# Patient Record
Sex: Female | Born: 1980 | Race: Black or African American | Hispanic: No | Marital: Married | Smoking: Never smoker
Health system: Southern US, Community
[De-identification: ages and names within clinical notes are randomized; demographics above are authoritative.]

## PROBLEM LIST (undated history)

## (undated) DIAGNOSIS — O09529 Supervision of elderly multigravida, unspecified trimester: Secondary | ICD-10-CM

## (undated) DIAGNOSIS — D649 Anemia, unspecified: Secondary | ICD-10-CM

## (undated) DIAGNOSIS — K589 Irritable bowel syndrome without diarrhea: Secondary | ICD-10-CM

## (undated) DIAGNOSIS — Z789 Other specified health status: Secondary | ICD-10-CM

## (undated) HISTORY — DX: Irritable bowel syndrome, unspecified: K58.9

## (undated) HISTORY — DX: Other specified health status: Z78.9

---

## 2006-05-23 ENCOUNTER — Ambulatory Visit: Payer: Self-pay | Admitting: Internal Medicine

## 2007-10-04 DIAGNOSIS — K219 Gastro-esophageal reflux disease without esophagitis: Secondary | ICD-10-CM | POA: Insufficient documentation

## 2007-10-04 DIAGNOSIS — K589 Irritable bowel syndrome without diarrhea: Secondary | ICD-10-CM

## 2008-01-24 ENCOUNTER — Encounter (INDEPENDENT_AMBULATORY_CARE_PROVIDER_SITE_OTHER): Payer: Self-pay | Admitting: *Deleted

## 2008-01-24 ENCOUNTER — Emergency Department (HOSPITAL_COMMUNITY): Admission: EM | Admit: 2008-01-24 | Discharge: 2008-01-24 | Payer: Self-pay | Admitting: Emergency Medicine

## 2008-01-28 ENCOUNTER — Telehealth: Payer: Self-pay | Admitting: Internal Medicine

## 2008-02-05 ENCOUNTER — Ambulatory Visit: Payer: Self-pay | Admitting: Internal Medicine

## 2008-02-05 ENCOUNTER — Encounter: Payer: Self-pay | Admitting: Physician Assistant

## 2008-02-05 DIAGNOSIS — R142 Eructation: Secondary | ICD-10-CM

## 2008-02-05 DIAGNOSIS — K5900 Constipation, unspecified: Secondary | ICD-10-CM | POA: Insufficient documentation

## 2008-02-05 DIAGNOSIS — R1084 Generalized abdominal pain: Secondary | ICD-10-CM | POA: Insufficient documentation

## 2008-02-05 DIAGNOSIS — R143 Flatulence: Secondary | ICD-10-CM

## 2008-02-05 DIAGNOSIS — R109 Unspecified abdominal pain: Secondary | ICD-10-CM

## 2008-02-05 DIAGNOSIS — R141 Gas pain: Secondary | ICD-10-CM

## 2008-02-07 ENCOUNTER — Ambulatory Visit: Payer: Self-pay | Admitting: Cardiology

## 2008-02-12 ENCOUNTER — Encounter: Payer: Self-pay | Admitting: Physician Assistant

## 2008-03-10 ENCOUNTER — Ambulatory Visit: Payer: Self-pay | Admitting: Internal Medicine

## 2008-07-16 ENCOUNTER — Telehealth: Payer: Self-pay | Admitting: Internal Medicine

## 2009-04-07 ENCOUNTER — Telehealth: Payer: Self-pay | Admitting: Internal Medicine

## 2010-03-09 NOTE — Progress Notes (Signed)
Summary: Medication refill   Phone Note Call from Patient   Caller: Patient Call For: Dr. Juanda Chance Reason for Call: Refill Medication Summary of Call: Pt needs her Miralax refilled Initial call taken by: Karna Christmas,  April 07, 2009 12:47 PM  Follow-up for Phone Call        Prescription sent. Follow-up by: Hortense Ramal CMA Duncan Dull),  April 07, 2009 12:56 PM    Prescriptions: MIRALAX  POWD (POLYETHYLENE GLYCOL 3350) Use 17 grams daily in 8 oz of water  #527 grams x 3   Entered by:   Hortense Ramal CMA (AAMA)   Authorized by:   Hart Carwin MD   Signed by:   Hortense Ramal CMA (AAMA) on 04/07/2009   Method used:   Electronically to        [facility]  [phone]* (retail)       [address]       [place], [place]  [address]       Ph: [phone] or [phone]       Fax: [phone]   RxID:   [id number]

## 2010-06-08 ENCOUNTER — Other Ambulatory Visit: Payer: Self-pay | Admitting: Internal Medicine

## 2010-06-25 NOTE — Assessment & Plan Note (Signed)
[facility]                         GASTROENTEROLOGY OFFICE NOTE   NOVELLE, ADDAIR                     MRN:          [id number]  DATE:05/23/2006                            DOB:          [birth year]    Marissa Kline is a very nice 30 year old African American female who comes  today for evaluation of periodic attacks of crampy abdominal pain that  had been occurring for several months.  They usually start within 30  minutes post prandially, usually after an evening meal and build up to a  severe crampy abdominal pain when she describes herself being doubled  over.  She experiences cramps until she has several bowel movements and  possibly even diarrhea.  After complete evacuation she feels much better  and is usually well for several weeks or several days at least.  She had  2 attacks last week, prior to that she was well for about 2 or 3 weeks.  Her daily schedule is regular; she usually eats breakfast, lunch, and  supper.  She eats at home, according to her most of the time.  She on  occasion has awakened at night with crampy abdominal pain.  There is no  family history of inflammatory bowel disease or bowel obstruction.  Her  mother currently is constipated.  Marissa Kline herself does not have very  good bowel habits, going to the bathroom about 2 or 3 times a week.  Her  weight has been stable.  She feels that her cramps are possibly worse  before the period.  She has never seen blood in her stool.   MEDICATIONS:  Diflucan 100 mg  one p.o. weekly.   PAST HISTORY:  Unremarkable, no operations.   FAMILY HISTORY:  Negative for colon cancer, inflammatory bowel disease.  Her mother has diabetes.   SOCIAL HISTORY:  Single, she works as a Investment banker, corporate.  She has a  Bachelor's Degree, she does not smoke, does not drink.   REVIEW OF SYSTEMS:  Positive for eyeglasses and allergies.  Pain with  periods.   PHYSICAL EXAMINATION:  Blood pressure 104/68,  pulse 68, weight 127  pounds.  She was healthy-appearing in no distress.  Sclerae was  nonicteric.  NECK:  Supple without adenopathy.  LUNGS:  Clear to auscultation.  COR:  Normal S1, normal S2.  ABDOMEN:  Soft with normoactive bowel sounds, no distention, good  muscular support.  Liver edge at costal margin.  I could not elicit any  tenderness, specifically no fullness in right lower quadrant.  No CVA  tenderness.  RECTAL:  Normal perianal area, rectum was normal, stool was Hemoccult  negative.   IMPRESSION:  A 30 year old female with symptoms of irritable bowel  syndrome and gastrocolic reflux described as crampy abdominal pain post  prandially, relieved by bowel movement.  Precipitating factors may be  fatty foods or larger meals.  her constipation may add to the problem.  There is no  clinical evidence to suggest inflammatory bowel disease.   PLAN:  1. We have talked extensively about irritable bowel syndrome, need for      high fiber diet, regular  eating habits, low fat lmodifications, and      either fiber supplements, stool softener, or probiotics of her      choice to regular her bowel habits.  The patient will start on      probiotic Activia yogurt daily.  If it does not work she will try      stool softeners, and if it does not work use mild laxative.  2. Levsin sublingually 0.125 mg dispense 30 with two refills, take 1      or 2 p.r.n. crampy abdominal pain.  3. Booklet on irritable bowel syndrome.  4. High fiber diet guidelines.   She will return on p.r.n. basis.     Hedwig Morton. Juanda Chance, MD  Electronically Signed    DMB/MedQ  DD: 05/23/2006  DT: 05/23/2006  Job #: [id number]   cc:   Sigmund Hazel, M.D.

## 2010-11-12 LAB — CBC
HCT: 35.7 % — ABNORMAL LOW (ref 36.0–46.0)
Hemoglobin: 11.9 g/dL — ABNORMAL LOW (ref 12.0–15.0)
MCHC: 33.3 g/dL (ref 30.0–36.0)
MCV: 86.8 fL (ref 78.0–100.0)
Platelets: 255 K/uL (ref 150–400)
RBC: 4.11 MIL/uL (ref 3.87–5.11)
RDW: 12.9 % (ref 11.5–15.5)
WBC: 5.2 K/uL (ref 4.0–10.5)

## 2010-11-12 LAB — DIFFERENTIAL
Basophils Absolute: 0 K/uL (ref 0.0–0.1)
Basophils Relative: 0 % (ref 0–1)
Eosinophils Absolute: 0.2 K/uL (ref 0.0–0.7)
Eosinophils Relative: 4 % (ref 0–5)
Lymphocytes Relative: 22 % (ref 12–46)
Lymphs Abs: 1.1 K/uL (ref 0.7–4.0)
Monocytes Absolute: 0.6 K/uL (ref 0.1–1.0)
Monocytes Relative: 11 % (ref 3–12)
Neutro Abs: 3.3 K/uL (ref 1.7–7.7)
Neutrophils Relative %: 64 % (ref 43–77)

## 2010-11-12 LAB — URINALYSIS, ROUTINE W REFLEX MICROSCOPIC
Bilirubin Urine: NEGATIVE
Glucose, UA: NEGATIVE mg/dL
Hgb urine dipstick: NEGATIVE
Ketones, ur: NEGATIVE mg/dL
Nitrite: NEGATIVE
Protein, ur: NEGATIVE mg/dL
Specific Gravity, Urine: 1.012 (ref 1.005–1.030)
Urobilinogen, UA: 0.2 mg/dL (ref 0.0–1.0)
pH: 6 (ref 5.0–8.0)

## 2010-11-12 LAB — COMPREHENSIVE METABOLIC PANEL
BUN: 9 mg/dL (ref 6–23)
CO2: 25 mEq/L (ref 19–32)
Chloride: 99 mEq/L (ref 96–112)
Creatinine, Ser: 0.58 mg/dL (ref 0.4–1.2)
GFR calc non Af Amer: >60 mL/min (ref >60)
Glucose, Bld: 93 mg/dL (ref 70–99)
Total Bilirubin: 1 mg/dL (ref 0.3–1.2)

## 2010-11-12 LAB — PREGNANCY, URINE: Preg Test, Ur: NEGATIVE

## 2010-11-12 LAB — LIPASE, BLOOD: Lipase: 28 U/L (ref 11–59)

## 2011-04-19 ENCOUNTER — Other Ambulatory Visit: Payer: Self-pay | Admitting: Internal Medicine

## 2011-04-19 NOTE — Telephone Encounter (Signed)
I am unable to give her any refills. We have not seen her in over 3 years. She is welcome to make an office visit if she would like [place] to supply a prescription. I have left a voicemail to advise patient.

## 2012-08-31 LAB — OB RESULTS CONSOLE GC/CHLAMYDIA
Chlamydia: NEGATIVE
Gonorrhea: NEGATIVE

## 2012-08-31 LAB — OB RESULTS CONSOLE HEPATITIS B SURFACE ANTIGEN: HEP B S AG: NEGATIVE

## 2012-08-31 LAB — OB RESULTS CONSOLE RUBELLA ANTIBODY, IGM: RUBELLA: IMMUNE

## 2012-08-31 LAB — OB RESULTS CONSOLE HIV ANTIBODY (ROUTINE TESTING): HIV: NONREACTIVE

## 2012-08-31 LAB — OB RESULTS CONSOLE ABO/RH: RH TYPE: POSITIVE

## 2012-08-31 LAB — OB RESULTS CONSOLE ANTIBODY SCREEN: Antibody Screen: NEGATIVE

## 2012-08-31 LAB — OB RESULTS CONSOLE RPR: RPR: NONREACTIVE

## 2013-02-20 LAB — OB RESULTS CONSOLE GBS: GBS: NEGATIVE

## 2013-03-05 ENCOUNTER — Ambulatory Visit (INDEPENDENT_AMBULATORY_CARE_PROVIDER_SITE_OTHER): Payer: 59 | Admitting: Physician Assistant

## 2013-03-05 VITALS — BP 110/68 | HR 112 | Temp 98.4°F | Resp 16 | Ht 61.25 in | Wt 177.0 lb

## 2013-03-05 DIAGNOSIS — J069 Acute upper respiratory infection, unspecified: Secondary | ICD-10-CM

## 2013-03-05 DIAGNOSIS — J029 Acute pharyngitis, unspecified: Secondary | ICD-10-CM

## 2013-03-05 DIAGNOSIS — B9789 Other viral agents as the cause of diseases classified elsewhere: Secondary | ICD-10-CM

## 2013-03-05 LAB — POCT CBC
Granulocyte percent: 71.1 %G (ref 37–80)
HEMATOCRIT: 32.5 % — AB (ref 37.7–47.9)
HEMOGLOBIN: 10 g/dL — AB (ref 12.2–16.2)
LYMPH, POC: 1.2 (ref 0.6–3.4)
MCH: 26.2 pg — AB (ref 27–31.2)
MCHC: 30.8 g/dL — AB (ref 31.8–35.4)
MCV: 85.3 fL (ref 80–97)
MID (cbc): 0.8 (ref 0–0.9)
MPV: 10.1 fL (ref 0–99.8)
POC GRANULOCYTE: 5 (ref 2–6.9)
POC LYMPH %: 17.8 % (ref 10–50)
POC MID %: 11.1 %M (ref 0–12)
Platelet Count, POC: 199 10*3/uL (ref 142–424)
RBC: 3.81 M/uL — AB (ref 4.04–5.48)
RDW, POC: 13.8 %
WBC: 7 10*3/uL (ref 4.6–10.2)

## 2013-03-05 LAB — POCT RAPID STREP A (OFFICE): Rapid Strep A Screen: NEGATIVE

## 2013-03-05 MED ORDER — GUAIFENESIN ER 1200 MG PO TB12: 1.0000 | ORAL_TABLET | Freq: Two times a day (BID) | ORAL | Status: AC

## 2013-03-05 NOTE — Progress Notes (Signed)
Subjective:    Patient ID: Marissa Kline, female    DOB: 03-08-1980, 33 y.o.   MRN: [id number]  HPI Pt presents to clinic with about 5 days of cold symptoms. She is having a worsening sore throat since yesterday.  Her sore throat was just in the am and it would get better with drinking and then last pm the sore throat was better in the afternoon and then last pm it was the worse that it has been.  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  OTC meds - Robitussin, Tylenol cold  39 weeks pregnancy Last appt 6 days ago (not problems) - next appt in 3 days No Flu vaccine  Review of Systems  Constitutional: Negative for fever and chills.  HENT: Positive for congestion, rhinorrhea (clear) and sore throat (worse in the am). Negative for postnasal drip.   Respiratory: Positive for cough (mild dry).   Musculoskeletal: Negative for myalgias.  Neurological: Negative for headaches.       Objective:   Physical Exam  Vitals reviewed. Constitutional: She is oriented to person, place, and time. She appears well-developed and well-nourished.  HENT:  Head: Normocephalic and atraumatic.  Right Ear: Hearing, tympanic membrane, external ear and ear canal normal.  Left Ear: Hearing, tympanic membrane, external ear and ear canal normal.  Nose: Mucosal edema present.  Mouth/Throat: Uvula is midline, oropharynx is clear and moist and mucous membranes are normal.  Eyes: Conjunctivae are normal.  Neck: Normal range of motion.  Cardiovascular: Normal rate, regular rhythm and normal heart sounds.   No murmur heard. Pulmonary/Chest: Effort normal and breath sounds normal. She has no wheezes.  Lymphadenopathy:    She has no cervical adenopathy.  Neurological: She is alert and oriented to person, place, and time.  Skin: Skin is warm and dry.  Psychiatric: She has a normal mood and affect. Her behavior is  normal. Judgment and thought content normal.   Results for orders placed in visit on 03/05/13  POCT RAPID STREP A (OFFICE)      Result Value Range   Rapid Strep A Screen Negative  Negative  POCT CBC      Result Value Range   WBC 7.0  4.6 - 10.2 K/uL   Lymph, poc 1.2  0.6 - 3.4   POC LYMPH PERCENT 17.8  10 - 50 %L   MID (cbc) 0.8  0 - 0.9   POC MID % 11.1  0 - 12 %M   POC Granulocyte 5.0  2 - 6.9   Granulocyte percent 71.1  37 - 80 %G   RBC 3.81 (*) 4.04 - 5.48 M/uL   Hemoglobin 10.0 (*) 12.2 - 16.2 g/dL   HCT, POC 40.9 (*) 81.1 - 47.9 %   MCV 85.3  80 - 97 fL   MCH, POC 26.2 (*) 27 - 31.2 pg   MCHC 30.8 (*) 31.8 - 35.4 g/dL   RDW, POC 91.4     Platelet Count, POC 199  142 - 424 K/uL   MPV 10.1  0 - 99.8 fL       Assessment & Plan:  Sore throat - Plan: POCT rapid strep A, POCT CBC  Viral URI with cough - Plan: Guaifenesin (MUCINEX MAXIMUM STRENGTH) 1200 MG TB12  We will symptomatically treat her with Mucinex - She can use a netti-pot or nasal saline to help with  congestion.  She will try a humidifier in her bedroom to help with congestion.  She will go to her normal schedule OB care in 3 days.  Pt has known anemia with this pregnancy.  Lasy Hemoglobin was 9.5 so today's is improved.  Benny LennertSarah Masaichi Kracht PA-C 03/05/2013 2:20 PM

## 2013-03-05 NOTE — Patient Instructions (Signed)
Hot liquids for sore thraot - honey can help sooth the throat also Humidifier in the bedroom can help with dry throat in the am Continue Tylenol for the pain in the throat - no Motrin or Advil or Ibuprofen

## 2013-03-13 ENCOUNTER — Inpatient Hospital Stay (HOSPITAL_COMMUNITY): Admission: AD | Admit: 2013-03-13 | Payer: Self-pay | Source: Ambulatory Visit | Admitting: Obstetrics and Gynecology

## 2013-03-18 ENCOUNTER — Telehealth (HOSPITAL_COMMUNITY): Payer: Self-pay | Admitting: *Deleted

## 2013-03-18 ENCOUNTER — Encounter (HOSPITAL_COMMUNITY): Payer: Self-pay | Admitting: *Deleted

## 2013-03-18 NOTE — Telephone Encounter (Signed)
Preadmission screen  

## 2013-03-21 ENCOUNTER — Encounter (HOSPITAL_COMMUNITY): Payer: Self-pay

## 2013-03-21 ENCOUNTER — Inpatient Hospital Stay (HOSPITAL_COMMUNITY)
Admission: RE | Admit: 2013-03-21 | Discharge: 2013-03-24 | DRG: 766 | Disposition: A | Discharge status: Home / Self Care | Payer: 59 | Source: Ambulatory Visit | Attending: Obstetrics and Gynecology | Admitting: Obstetrics and Gynecology

## 2013-03-21 VITALS — BP 114/79 | HR 72 | Temp 98.2°F | Resp 18 | Ht 61.0 in | Wt 180.0 lb

## 2013-03-21 DIAGNOSIS — O480 Post-term pregnancy: Principal | ICD-10-CM | POA: Diagnosis present

## 2013-03-21 DIAGNOSIS — Z9889 Other specified postprocedural states: Secondary | ICD-10-CM

## 2013-03-21 LAB — CBC
HCT: 31.3 % — ABNORMAL LOW (ref 36.0–46.0)
Hemoglobin: 10.4 g/dL — ABNORMAL LOW (ref 12.0–15.0)
MCH: 26.4 pg (ref 26.0–34.0)
MCHC: 33.2 g/dL (ref 30.0–36.0)
MCV: 79.4 fL (ref 78.0–100.0)
PLATELETS: 214 10*3/uL (ref 150–400)
RBC: 3.94 MIL/uL (ref 3.87–5.11)
RDW: 14.6 % (ref 11.5–15.5)
WBC: 9 10*3/uL (ref 4.0–10.5)

## 2013-03-21 LAB — TYPE AND SCREEN
ABO/RH(D): A POS
Antibody Screen: NEGATIVE

## 2013-03-21 MED ORDER — CITRIC ACID-SODIUM CITRATE 334-500 MG/5ML PO SOLN
30.0000 mL | ORAL | Status: DC | PRN
  Administered 2013-03-22: 30 mL via ORAL
  Filled 2013-03-21: qty 15

## 2013-03-21 MED ORDER — OXYCODONE-ACETAMINOPHEN 5-325 MG PO TABS: 1.0000 | ORAL_TABLET | ORAL | Status: DC | PRN

## 2013-03-21 MED ORDER — ZOLPIDEM TARTRATE 5 MG PO TABS
5.0000 mg | ORAL_TABLET | Freq: Every evening | ORAL | Status: DC | PRN
  Administered 2013-03-22: 5 mg via ORAL
  Filled 2013-03-21: qty 1

## 2013-03-21 MED ORDER — MISOPROSTOL 25 MCG QUARTER TABLET
25.0000 ug | ORAL_TABLET | ORAL | Status: DC | PRN
  Administered 2013-03-21: 25 ug via VAGINAL
  Filled 2013-03-21: qty 0.25

## 2013-03-21 MED ORDER — ACETAMINOPHEN 325 MG PO TABS: 650.0000 mg | ORAL_TABLET | ORAL | Status: DC | PRN

## 2013-03-21 MED ORDER — IBUPROFEN 600 MG PO TABS: 600.0000 mg | ORAL_TABLET | Freq: Four times a day (QID) | ORAL | Status: DC | PRN

## 2013-03-21 MED ORDER — OXYTOCIN 40 UNITS IN LACTATED RINGERS INFUSION - SIMPLE MED: 62.5000 mL/h | INTRAVENOUS | Status: DC

## 2013-03-21 MED ORDER — ONDANSETRON HCL 4 MG/2ML IJ SOLN: 4.0000 mg | Freq: Four times a day (QID) | INTRAMUSCULAR | Status: DC | PRN

## 2013-03-21 MED ORDER — LACTATED RINGERS IV SOLN
500.0000 mL | INTRAVENOUS | Status: DC | PRN
  Administered 2013-03-22: 500 mL via INTRAVENOUS

## 2013-03-21 MED ORDER — OXYTOCIN BOLUS FROM INFUSION: 500.0000 mL | INTRAVENOUS | Status: DC

## 2013-03-21 MED ORDER — BUTORPHANOL TARTRATE 1 MG/ML IJ SOLN
1.0000 mg | INTRAMUSCULAR | Status: DC | PRN
  Administered 2013-03-22 (×2): 1 mg via INTRAVENOUS
  Filled 2013-03-21 (×2): qty 1

## 2013-03-21 MED ORDER — LACTATED RINGERS IV SOLN
INTRAVENOUS | Status: DC
  Administered 2013-03-21 – 2013-03-22 (×3): via INTRAVENOUS

## 2013-03-21 MED ORDER — TERBUTALINE SULFATE 1 MG/ML IJ SOLN
0.2500 mg | Freq: Once | INTRAMUSCULAR | Status: AC | PRN
Start: 2013-03-21 — End: 2013-03-21

## 2013-03-21 MED ORDER — LIDOCAINE HCL (PF) 1 % IJ SOLN: 30.0000 mL | INTRAMUSCULAR | Status: DC | PRN

## 2013-03-22 ENCOUNTER — Inpatient Hospital Stay (HOSPITAL_COMMUNITY): Payer: 59 | Admitting: Anesthesiology

## 2013-03-22 ENCOUNTER — Encounter (HOSPITAL_COMMUNITY): Payer: 59 | Admitting: Anesthesiology

## 2013-03-22 ENCOUNTER — Encounter (HOSPITAL_COMMUNITY)
Admission: RE | Disposition: A | Discharge status: Home / Self Care | Payer: Self-pay | Source: Ambulatory Visit | Attending: Obstetrics and Gynecology

## 2013-03-22 ENCOUNTER — Encounter (HOSPITAL_COMMUNITY): Payer: Self-pay

## 2013-03-22 DIAGNOSIS — Z9889 Other specified postprocedural states: Secondary | ICD-10-CM

## 2013-03-22 LAB — RPR: RPR Ser Ql: NONREACTIVE

## 2013-03-22 LAB — ABO/RH: ABO/RH(D): A POS

## 2013-03-22 SURGERY — Surgical Case
Anesthesia: Epidural | Site: Abdomen

## 2013-03-22 MED ORDER — TETANUS-DIPHTH-ACELL PERTUSSIS 5-2.5-18.5 LF-MCG/0.5 IM SUSP: 0.5000 mL | Freq: Once | INTRAMUSCULAR | Status: DC

## 2013-03-22 MED ORDER — FERROUS SULFATE 325 (65 FE) MG PO TABS
325.0000 mg | ORAL_TABLET | Freq: Two times a day (BID) | ORAL | Status: DC
  Administered 2013-03-22 – 2013-03-24 (×4): 325 mg via ORAL
  Filled 2013-03-22 (×4): qty 1

## 2013-03-22 MED ORDER — LACTATED RINGERS IV SOLN
INTRAVENOUS | Status: DC
  Administered 2013-03-22: 23:00:00 via INTRAVENOUS

## 2013-03-22 MED ORDER — DIPHENHYDRAMINE HCL 50 MG/ML IJ SOLN: 12.5000 mg | INTRAMUSCULAR | Status: DC | PRN

## 2013-03-22 MED ORDER — PROMETHAZINE HCL 25 MG/ML IJ SOLN: 6.2500 mg | INTRAMUSCULAR | Status: DC | PRN

## 2013-03-22 MED ORDER — PHENYLEPHRINE 40 MCG/ML (10ML) SYRINGE FOR IV PUSH (FOR BLOOD PRESSURE SUPPORT): 80.0000 ug | PREFILLED_SYRINGE | INTRAVENOUS | Status: DC | PRN

## 2013-03-22 MED ORDER — HYDROMORPHONE HCL PF 1 MG/ML IJ SOLN
INTRAMUSCULAR | Status: AC
  Filled 2013-03-22: qty 1

## 2013-03-22 MED ORDER — FAMOTIDINE-CA CARB-MAG HYDROX 10-800-165 MG PO CHEW: 1.0000 | CHEWABLE_TABLET | Freq: Two times a day (BID) | ORAL | Status: DC | PRN

## 2013-03-22 MED ORDER — LANOLIN HYDROUS EX OINT: 1.0000 "application " | TOPICAL_OINTMENT | CUTANEOUS | Status: DC | PRN

## 2013-03-22 MED ORDER — FENTANYL 2.5 MCG/ML BUPIVACAINE 1/10 % EPIDURAL INFUSION (WH - ANES): 14.0000 mL/h | INTRAMUSCULAR | Status: DC | PRN

## 2013-03-22 MED ORDER — FLEET ENEMA 7-19 GM/118ML RE ENEM: 1.0000 | ENEMA | Freq: Every day | RECTAL | Status: DC | PRN

## 2013-03-22 MED ORDER — BISACODYL 10 MG RE SUPP: 10.0000 mg | Freq: Every day | RECTAL | Status: DC | PRN

## 2013-03-22 MED ORDER — NALBUPHINE HCL 10 MG/ML IJ SOLN: 5.0000 mg | INTRAMUSCULAR | Status: DC | PRN

## 2013-03-22 MED ORDER — METHYLERGONOVINE MALEATE 0.2 MG/ML IJ SOLN: 0.2000 mg | INTRAMUSCULAR | Status: DC | PRN

## 2013-03-22 MED ORDER — MEASLES, MUMPS & RUBELLA VAC ~~LOC~~ INJ
0.5000 mL | INJECTION | Freq: Once | SUBCUTANEOUS | Status: DC
  Filled 2013-03-22: qty 0.5

## 2013-03-22 MED ORDER — ONDANSETRON HCL 4 MG/2ML IJ SOLN
4.0000 mg | INTRAMUSCULAR | Status: DC | PRN
  Administered 2013-03-22: 4 mg via INTRAVENOUS
  Filled 2013-03-22: qty 2

## 2013-03-22 MED ORDER — SODIUM BICARBONATE 8.4 % IV SOLN
INTRAVENOUS | Status: AC
  Filled 2013-03-22: qty 50

## 2013-03-22 MED ORDER — SCOPOLAMINE 1 MG/3DAYS TD PT72
1.0000 | MEDICATED_PATCH | Freq: Once | TRANSDERMAL | Status: DC
  Administered 2013-03-22: 1.5 mg via TRANSDERMAL

## 2013-03-22 MED ORDER — OXYTOCIN 40 UNITS IN LACTATED RINGERS INFUSION - SIMPLE MED: 62.5000 mL/h | INTRAVENOUS | Status: AC

## 2013-03-22 MED ORDER — MORPHINE SULFATE 0.5 MG/ML IJ SOLN
INTRAMUSCULAR | Status: AC
  Filled 2013-03-22: qty 10

## 2013-03-22 MED ORDER — HYDROMORPHONE HCL PF 1 MG/ML IJ SOLN
0.2500 mg | INTRAMUSCULAR | Status: DC | PRN
  Administered 2013-03-22 (×2): 0.5 mg via INTRAVENOUS

## 2013-03-22 MED ORDER — MEPERIDINE HCL 25 MG/ML IJ SOLN: 6.2500 mg | INTRAMUSCULAR | Status: DC | PRN

## 2013-03-22 MED ORDER — EPHEDRINE 5 MG/ML INJ
INTRAVENOUS | Status: DC
Start: 2013-03-22 — End: 2013-03-22
  Filled 2013-03-22: qty 4

## 2013-03-22 MED ORDER — KETOROLAC TROMETHAMINE 30 MG/ML IJ SOLN
30.0000 mg | Freq: Four times a day (QID) | INTRAMUSCULAR | Status: AC | PRN
Start: 2013-03-22 — End: 2013-03-23
  Administered 2013-03-22: 30 mg via INTRAMUSCULAR

## 2013-03-22 MED ORDER — SIMETHICONE 80 MG PO CHEW
80.0000 mg | CHEWABLE_TABLET | ORAL | Status: DC | PRN
Start: 2013-03-22 — End: 2013-03-24

## 2013-03-22 MED ORDER — NALBUPHINE HCL 10 MG/ML IJ SOLN
5.0000 mg | INTRAMUSCULAR | Status: DC | PRN
Start: 2013-03-22 — End: 2013-03-24

## 2013-03-22 MED ORDER — MORPHINE SULFATE (PF) 0.5 MG/ML IJ SOLN
INTRAMUSCULAR | Status: DC | PRN
  Administered 2013-03-22: 3 mg via EPIDURAL

## 2013-03-22 MED ORDER — SODIUM BICARBONATE 8.4 % IV SOLN
INTRAVENOUS | Status: DC | PRN
  Administered 2013-03-22: 4 mL via EPIDURAL
  Administered 2013-03-22: 3 mL via EPIDURAL
  Administered 2013-03-22: 12 mL via EPIDURAL

## 2013-03-22 MED ORDER — ONDANSETRON HCL 4 MG/2ML IJ SOLN: 4.0000 mg | Freq: Three times a day (TID) | INTRAMUSCULAR | Status: DC | PRN

## 2013-03-22 MED ORDER — SODIUM CHLORIDE 0.9 % IJ SOLN: 3.0000 mL | INTRAMUSCULAR | Status: DC | PRN

## 2013-03-22 MED ORDER — FENTANYL 2.5 MCG/ML BUPIVACAINE 1/10 % EPIDURAL INFUSION (WH - ANES)
INTRAMUSCULAR | Status: AC
  Filled 2013-03-22: qty 125

## 2013-03-22 MED ORDER — DIBUCAINE 1 % RE OINT: 1.0000 "application " | TOPICAL_OINTMENT | RECTAL | Status: DC | PRN

## 2013-03-22 MED ORDER — LIDOCAINE-EPINEPHRINE (PF) 2 %-1:200000 IJ SOLN
INTRAMUSCULAR | Status: AC
  Filled 2013-03-22: qty 20

## 2013-03-22 MED ORDER — METOCLOPRAMIDE HCL 5 MG/ML IJ SOLN: 10.0000 mg | Freq: Three times a day (TID) | INTRAMUSCULAR | Status: DC | PRN

## 2013-03-22 MED ORDER — OXYTOCIN 10 UNIT/ML IJ SOLN
40.0000 [IU] | INTRAVENOUS | Status: DC | PRN
  Administered 2013-03-22: 40 [IU] via INTRAVENOUS

## 2013-03-22 MED ORDER — MORPHINE SULFATE (PF) 0.5 MG/ML IJ SOLN
INTRAMUSCULAR | Status: DC | PRN
  Administered 2013-03-22: 2 mg via INTRAVENOUS

## 2013-03-22 MED ORDER — PHENYLEPHRINE 40 MCG/ML (10ML) SYRINGE FOR IV PUSH (FOR BLOOD PRESSURE SUPPORT)
PREFILLED_SYRINGE | INTRAVENOUS | Status: AC
  Filled 2013-03-22: qty 10

## 2013-03-22 MED ORDER — FENTANYL 2.5 MCG/ML BUPIVACAINE 1/10 % EPIDURAL INFUSION (WH - ANES)
INTRAMUSCULAR | Status: DC | PRN
  Administered 2013-03-22: 14 mL/h via EPIDURAL

## 2013-03-22 MED ORDER — EPHEDRINE 5 MG/ML INJ: 10.0000 mg | INTRAVENOUS | Status: DC | PRN

## 2013-03-22 MED ORDER — SCOPOLAMINE 1 MG/3DAYS TD PT72
MEDICATED_PATCH | TRANSDERMAL | Status: AC
  Administered 2013-03-22: 1.5 mg via TRANSDERMAL
  Filled 2013-03-22: qty 1

## 2013-03-22 MED ORDER — IBUPROFEN 600 MG PO TABS: 600.0000 mg | ORAL_TABLET | Freq: Four times a day (QID) | ORAL | Status: DC | PRN

## 2013-03-22 MED ORDER — ONDANSETRON HCL 4 MG/2ML IJ SOLN
INTRAMUSCULAR | Status: DC | PRN
  Administered 2013-03-22: 4 mg via INTRAVENOUS

## 2013-03-22 MED ORDER — DIPHENHYDRAMINE HCL 25 MG PO CAPS: 25.0000 mg | ORAL_CAPSULE | Freq: Four times a day (QID) | ORAL | Status: DC | PRN

## 2013-03-22 MED ORDER — PRENATAL MULTIVITAMIN CH
1.0000 | ORAL_TABLET | Freq: Every day | ORAL | Status: DC
  Administered 2013-03-23: 1 via ORAL
  Filled 2013-03-22: qty 1

## 2013-03-22 MED ORDER — LIDOCAINE HCL (PF) 1 % IJ SOLN
INTRAMUSCULAR | Status: DC | PRN
  Administered 2013-03-22: 9 mL
  Administered 2013-03-22: 8 mL

## 2013-03-22 MED ORDER — IBUPROFEN 600 MG PO TABS
600.0000 mg | ORAL_TABLET | Freq: Four times a day (QID) | ORAL | Status: DC
  Administered 2013-03-22 – 2013-03-24 (×7): 600 mg via ORAL
  Filled 2013-03-22 (×8): qty 1

## 2013-03-22 MED ORDER — KETOROLAC TROMETHAMINE 30 MG/ML IJ SOLN
INTRAMUSCULAR | Status: AC
  Administered 2013-03-22: 30 mg via INTRAMUSCULAR
  Filled 2013-03-22: qty 1

## 2013-03-22 MED ORDER — DEXTROSE 5 % IV SOLN
1.0000 ug/kg/h | INTRAVENOUS | Status: DC | PRN
  Filled 2013-03-22: qty 2

## 2013-03-22 MED ORDER — SIMETHICONE 80 MG PO CHEW
80.0000 mg | CHEWABLE_TABLET | ORAL | Status: DC
  Administered 2013-03-22 – 2013-03-24 (×2): 80 mg via ORAL
  Filled 2013-03-22 (×2): qty 1

## 2013-03-22 MED ORDER — KETOROLAC TROMETHAMINE 30 MG/ML IJ SOLN: 15.0000 mg | Freq: Once | INTRAMUSCULAR | Status: DC | PRN

## 2013-03-22 MED ORDER — ONDANSETRON HCL 4 MG/2ML IJ SOLN
INTRAMUSCULAR | Status: AC
  Filled 2013-03-22: qty 2

## 2013-03-22 MED ORDER — SIMETHICONE 80 MG PO CHEW
80.0000 mg | CHEWABLE_TABLET | Freq: Three times a day (TID) | ORAL | Status: DC
  Administered 2013-03-22 – 2013-03-24 (×5): 80 mg via ORAL
  Filled 2013-03-22 (×5): qty 1

## 2013-03-22 MED ORDER — NALOXONE HCL 0.4 MG/ML IJ SOLN: 0.4000 mg | INTRAMUSCULAR | Status: DC | PRN

## 2013-03-22 MED ORDER — KETOROLAC TROMETHAMINE 30 MG/ML IJ SOLN: 30.0000 mg | Freq: Four times a day (QID) | INTRAMUSCULAR | Status: AC | PRN

## 2013-03-22 MED ORDER — CEFAZOLIN SODIUM-DEXTROSE 2-3 GM-% IV SOLR
INTRAVENOUS | Status: DC | PRN
  Administered 2013-03-22: 2 g via INTRAVENOUS

## 2013-03-22 MED ORDER — FAMOTIDINE 20 MG PO TABS: 10.0000 mg | ORAL_TABLET | Freq: Two times a day (BID) | ORAL | Status: DC | PRN

## 2013-03-22 MED ORDER — METHYLERGONOVINE MALEATE 0.2 MG PO TABS: 0.2000 mg | ORAL_TABLET | ORAL | Status: DC | PRN

## 2013-03-22 MED ORDER — SENNOSIDES-DOCUSATE SODIUM 8.6-50 MG PO TABS
2.0000 | ORAL_TABLET | ORAL | Status: DC
  Administered 2013-03-22 – 2013-03-24 (×2): 2 via ORAL
  Filled 2013-03-22 (×2): qty 2

## 2013-03-22 MED ORDER — DIPHENHYDRAMINE HCL 25 MG PO CAPS
25.0000 mg | ORAL_CAPSULE | ORAL | Status: DC | PRN
  Filled 2013-03-22: qty 1

## 2013-03-22 MED ORDER — WITCH HAZEL-GLYCERIN EX PADS
1.0000 | MEDICATED_PAD | CUTANEOUS | Status: DC | PRN
Start: 2013-03-22 — End: 2013-03-24

## 2013-03-22 MED ORDER — MENTHOL 3 MG MT LOZG: 1.0000 | LOZENGE | OROMUCOSAL | Status: DC | PRN

## 2013-03-22 MED ORDER — LACTATED RINGERS IV SOLN
500.0000 mL | Freq: Once | INTRAVENOUS | Status: AC
  Administered 2013-03-22: 09:00:00 via INTRAVENOUS

## 2013-03-22 MED ORDER — OXYTOCIN 10 UNIT/ML IJ SOLN
INTRAMUSCULAR | Status: AC
  Filled 2013-03-22: qty 4

## 2013-03-22 MED ORDER — ZOLPIDEM TARTRATE 5 MG PO TABS: 5.0000 mg | ORAL_TABLET | Freq: Every evening | ORAL | Status: DC | PRN

## 2013-03-22 MED ORDER — ONDANSETRON HCL 4 MG PO TABS: 4.0000 mg | ORAL_TABLET | ORAL | Status: DC | PRN

## 2013-03-22 MED ORDER — DIPHENHYDRAMINE HCL 50 MG/ML IJ SOLN: 25.0000 mg | INTRAMUSCULAR | Status: DC | PRN

## 2013-03-22 MED ORDER — OXYCODONE-ACETAMINOPHEN 5-325 MG PO TABS: 1.0000 | ORAL_TABLET | ORAL | Status: DC | PRN

## 2013-03-22 SURGICAL SUPPLY — 37 items
APL SKNCLS STERI-STRIP NONHPOA (GAUZE/BANDAGES/DRESSINGS) ×1
BENZOIN TINCTURE PRP APPL 2/3 (GAUZE/BANDAGES/DRESSINGS) ×3 IMPLANT
CLAMP CORD UMBIL (MISCELLANEOUS) IMPLANT
CLOSURE WOUND 1/2 X4 (GAUZE/BANDAGES/DRESSINGS) ×1
CLOTH BEACON ORANGE TIMEOUT ST (SAFETY) ×3 IMPLANT
DRAPE LG THREE QUARTER DISP (DRAPES) IMPLANT
DRSG OPSITE POSTOP 4X10 (GAUZE/BANDAGES/DRESSINGS) ×3 IMPLANT
DURAPREP 26ML APPLICATOR (WOUND CARE) ×3 IMPLANT
ELECT REM PT RETURN 9FT ADLT (ELECTROSURGICAL) ×3
ELECTRODE REM PT RTRN 9FT ADLT (ELECTROSURGICAL) ×1 IMPLANT
EXTRACTOR VACUUM BELL STYLE (SUCTIONS) IMPLANT
GLOVE BIO SURGEON STRL SZ7 (GLOVE) ×3 IMPLANT
GOWN STRL REUS W/ TWL XL LVL3 (GOWN DISPOSABLE) ×1 IMPLANT
GOWN STRL REUS W/TWL LRG LVL3 (GOWN DISPOSABLE) ×3 IMPLANT
GOWN STRL REUS W/TWL XL LVL3 (GOWN DISPOSABLE) ×3
KIT ABG SYR 3ML LUER SLIP (SYRINGE) ×2 IMPLANT
NDL HYPO 25X5/8 SAFETYGLIDE (NEEDLE) IMPLANT
NEEDLE HYPO 25X5/8 SAFETYGLIDE (NEEDLE) ×3 IMPLANT
NS IRRIG 1000ML POUR BTL (IV SOLUTION) ×3 IMPLANT
PACK C SECTION WH (CUSTOM PROCEDURE TRAY) ×3 IMPLANT
PAD OB MATERNITY 4.3X12.25 (PERSONAL CARE ITEMS) ×3 IMPLANT
RETRACTOR WND ALEXIS 25 LRG (MISCELLANEOUS) ×1 IMPLANT
RTRCTR WOUND ALEXIS 25CM LRG (MISCELLANEOUS) ×3
STAPLER VISISTAT 35W (STAPLE) IMPLANT
STRIP CLOSURE SKIN 1/2X4 (GAUZE/BANDAGES/DRESSINGS) ×2 IMPLANT
SUT MNCRL 0 VIOLET CTX 36 (SUTURE) ×2 IMPLANT
SUT MONOCRYL 0 CTX 36 (SUTURE) ×6
SUT PDS AB 0 CTX 60 (SUTURE) IMPLANT
SUT PLAIN 2 0 XLH (SUTURE) ×2 IMPLANT
SUT VIC AB 0 CT1 27 (SUTURE) ×6
SUT VIC AB 0 CT1 27XBRD ANBCTR (SUTURE) ×2 IMPLANT
SUT VIC AB 2-0 CT1 27 (SUTURE) ×3
SUT VIC AB 2-0 CT1 TAPERPNT 27 (SUTURE) ×1 IMPLANT
SUT VIC AB 4-0 KS 27 (SUTURE) ×3 IMPLANT
TOWEL OR 17X24 6PK STRL BLUE (TOWEL DISPOSABLE) ×3 IMPLANT
TRAY FOLEY CATH 14FR (SET/KITS/TRAYS/PACK) ×3 IMPLANT
WATER STERILE IRR 1000ML POUR (IV SOLUTION) ×1 IMPLANT

## 2013-03-22 NOTE — Addendum Note (Signed)
Addendum created 03/22/13 1959 by Lincoln BrighamAngela Draughon Maryssa Giampietro, CRNA   Modules edited: Notes Section   Notes Section:  File: [id number]

## 2013-03-22 NOTE — Anesthesia Postprocedure Evaluation (Signed)
  Anesthesia Post Note  Patient: Benay SpiceSebrina A Holloman  Procedure(s) Performed: Procedure(s) (LRB): CESAREAN SECTION (N/A)  Anesthesia type: epi   Patient location: PACU  Post pain: Pain level controlled  Post assessment: Post-op Vital signs reviewed  Last Vitals:  Filed Vitals:   03/22/13 1145  BP: 108/43  Pulse: 82  Temp:   Resp: 18    Post vital signs: Reviewed  Level of consciousness: awake  Complications: No apparent anesthesia complications

## 2013-03-22 NOTE — Progress Notes (Signed)
After seeing pt earlier, pt received her epidural.  Since that time pt had another 3 minute severe variable decel down to 60s and several decels that appeared like lates just before and after.  SVE 1/50/-1; AROM thick mec.  Although strip has reactivity in between episodes, the fetus is clearly distressed and the decels are not reassuring so far from getting pt even into labor, let alone delivered.  I d/w this with the pt and she agrees to proceed with LTCS for nonreassuring FHTs remote from vaginal delivery.

## 2013-03-22 NOTE — Preoperative (Signed)
Beta Blockers   Reason not to administer Beta Blockers:Not Applicable 

## 2013-03-22 NOTE — Progress Notes (Signed)
03/22/13 1600  Clinical Encounter Type  Visited With Patient and family together (spouse)  Visit Type (Advance Directives consult)  Referral From Nurse (F. Morris, RN)   Visited briefly with Marissa Kline to offer spiritual/emotional support and requested information about Advance Directives.  She was experiencing significant nausea, so we focused on AD.  Provided her blue information sheet, noting that RN can provide AD packet if desired.  Spiritual Care can assist with discussing/reviewing material and completing forms if pt desires.  Please page [phone] if needed.  Thank you.  [address]Chaplain Eriyonna Matsushita [place], [place] [phone]

## 2013-03-22 NOTE — Lactation Note (Signed)
This note was copied from the chart of Marissa Lonia ChimeraSebrina Simmonds. Lactation Consultation Note Initial consult:  Baby Marissa 12 hours old sleeping STS on FOB.  Mother states he has been breastfeeding well.  RN taught mother hand expression and she had drops of colostrum.  Reviewed basics, lactation support services and brochure.  Mother wanted to know when she could get her free [facility] breast pump. Suggested she recheck with her insurance company and visit our store on Monday before she leaves.  Encouraged mother to call for further assistance.   Patient Name: Marissa Lonia ChimeraSebrina Colebank RUEAV'WToday's Date: 03/22/2013 Reason for consult: Initial assessment   Maternal Data Has patient been taught Hand Expression?: Yes Does the patient have breastfeeding experience prior to this delivery?: No  Feeding Feeding Type: Breast Fed Length of feed: 20 min  LATCH Score/Interventions Latch: Repeated attempts needed to sustain latch, nipple held in mouth throughout feeding, stimulation needed to elicit sucking reflex. Intervention(s): Adjust position;Assist with latch;Breast massage;Breast compression  Audible Swallowing: A few with stimulation Intervention(s): Skin to skin  Type of Nipple: Everted at rest and after stimulation  Comfort (Breast/Nipple): Soft / non-tender     Hold (Positioning): Assistance needed to correctly position infant at breast and maintain latch. Intervention(s): Support Pillows;Breastfeeding basics reviewed;Position options;Skin to skin  LATCH Score: 7  Lactation Tools Discussed/Used     Consult Status Consult Status: Follow-up Date: 03/23/13 Follow-up type: In-patient    Dahlia ByesBerkelhammer, Davetta Olliff [facility] 03/22/2013, 10:56 PM

## 2013-03-22 NOTE — Anesthesia Postprocedure Evaluation (Signed)
Anesthesia Post Note  Patient: Marissa Kline  Procedure(s) Performed: Procedure(s) (LRB): CESAREAN SECTION (N/A)  Anesthesia type: Epidural  Patient location: Mother/Baby  Post pain: Pain level controlled  Post assessment: Post-op Vital signs reviewed  Last Vitals:  Filed Vitals:   03/22/13 1934  BP: 105/72  Pulse: 103  Temp:   Resp: 18    Post vital signs: Reviewed  Level of consciousness:alert  Complications: No apparent anesthesia complications

## 2013-03-22 NOTE — Anesthesia Preprocedure Evaluation (Signed)
Anesthesia Evaluation  Patient identified by MRN, date of birth, ID band Patient awake    Reviewed: Allergy & Precautions, H&P , NPO status , Patient's Chart, lab work & pertinent test results  Airway Mallampati: II TM Distance: >3 FB Neck ROM: full    Dental no notable dental hx.    Pulmonary neg pulmonary ROS,    Pulmonary exam normal       Cardiovascular negative cardio ROS      Neuro/Psych negative neurological ROS  negative psych ROS   GI/Hepatic negative GI ROS, Neg liver ROS,   Endo/Other  negative endocrine ROS  Renal/GU negative Renal ROS  negative genitourinary   Musculoskeletal negative musculoskeletal ROS (+)   Abdominal Normal abdominal exam  (+)   Peds  Hematology negative hematology ROS (+)   Anesthesia Other Findings   Reproductive/Obstetrics (+) Pregnancy                           Anesthesia Physical Anesthesia Plan  ASA: II  Anesthesia Plan: Epidural   Post-op Pain Management:    Induction:   Airway Management Planned:   Additional Equipment:   Intra-op Plan:   Post-operative Plan:   Informed Consent: I have reviewed the patients History and Physical, chart, labs and discussed the procedure including the risks, benefits and alternatives for the proposed anesthesia with the patient or authorized representative who has indicated his/her understanding and acceptance.     Plan Discussed with:   Anesthesia Plan Comments:         Anesthesia Quick Evaluation  

## 2013-03-22 NOTE — H&P (Signed)
33 y.o. 7042w2d  G2P0010 comes in for induction post dates.  Otherwise has good fetal movement and no bleeding.  Past Medical History  Diagnosis Date  . Medical history non-contributory     Past Surgical History  Procedure Laterality Date  . No past surgeries      OB History  Gravida Para Term Preterm AB SAB TAB Ectopic Multiple Living  2 0 0 0 1 0 1 0 0 0     # Outcome Date GA Lbr Len/2nd Weight Sex Delivery Anes PTL Lv  2 CUR           1 TAB               History   Social History  . Marital Status: Married    Spouse Name: N/A    Number of Children: N/A  . Years of Education: N/A   Occupational History  . Not on file.   Social History Main Topics  . Smoking status: Never Smoker   . Smokeless tobacco: Never Used  . Alcohol Use: No  . Drug Use: No  . Sexual Activity: Yes    Birth Control/ Protection: Pill   Other Topics Concern  . Not on file   Social History Narrative  . No narrative on file   Review of patient's allergies indicates no known allergies.    Prenatal Transfer Tool  Maternal Diabetes: No Genetic Screening: Normal Maternal Ultrasounds/Referrals: Normal Fetal Ultrasounds or other Referrals:  None Maternal Substance Abuse:  No Significant Maternal Medications:  None Significant Maternal Lab Results: None  Other PNC: uncomplicated.    Filed Vitals:   03/22/13 0726  BP: 131/48  Pulse: 68  Temp:   Resp: 18     Lungs/Cor:  NAD Abdomen:  soft, gravid Ex:  no cords, erythema SVE:  1/30/-3, unable to ROM secondary discomfort. FHTs:  130s, good STV, NST R; at 2 am pt had multiple severe variable decels that resolved with resuscitation.  Only occ mild variables seen now. Toco:  q4-5   A/P   Postdates induction.  Epidural and ROM with IUPC placement.  Pt contracting on own and unable to start pit secondary contractions.    GBS neg.  Shanelle Clontz A

## 2013-03-22 NOTE — Transfer of Care (Signed)
Immediate Anesthesia Transfer of Care Note  Patient: Marissa Kline  Procedure(s) Performed: Procedure(s): CESAREAN SECTION (N/A)  Patient Location: PACU  Anesthesia Type:Epidural  Level of Consciousness: awake, alert  and oriented  Airway & Oxygen Therapy: Patient Spontanous Breathing and Patient connected to nasal cannula oxygen  Post-op Assessment: Report given to PACU RN and Post -op Vital signs reviewed and stable  Post vital signs: Reviewed and stable  Complications: No apparent anesthesia complications

## 2013-03-22 NOTE — Anesthesia Procedure Notes (Signed)
Epidural Patient location during procedure: OB Start time: 03/22/2013 9:34 AM End time: 03/22/2013 9:38 AM  Staffing Anesthesiologist: Leilani AbleHATCHETT, Faizon Capozzi Performed by: anesthesiologist   Preanesthetic Checklist Completed: patient identified, surgical consent, pre-op evaluation, timeout performed, IV checked, risks and benefits discussed and monitors and equipment checked  Epidural Patient position: sitting Prep: site prepped and draped and DuraPrep Patient monitoring: continuous pulse ox and blood pressure Approach: midline Injection technique: LOR air  Needle:  Needle type: Tuohy  Needle gauge: 17 G Needle length: 9 cm and 9 Needle insertion depth: 6 cm Catheter type: closed end flexible Catheter size: 19 Gauge Catheter at skin depth: 11 cm Test dose: negative and Other  Assessment Sensory level: T9 Events: blood not aspirated, injection not painful, no injection resistance, negative IV test and no paresthesia  Additional Notes Reason for block:procedure for pain

## 2013-03-22 NOTE — Lactation Note (Signed)
This note was copied from the chart of Marissa Kline. Lactation Consultation Note Follow up consult:  Mother called for assistance with latch.  LS9. Rhythmical sucking, swallows observed.  Repositioned baby to mother's breast height.  Mother asked to review again hand expression, teach back, good drops of colostrum viewed.   Patient Name: Marissa Lonia ChimeraSebrina Winther [id number]'s Date: 03/22/2013 Reason for consult: Follow-up assessment   Maternal Data Has patient been taught Hand Expression?: Yes Does the patient have breastfeeding experience prior to this delivery?: No  Feeding Feeding Type: Breast Fed  LATCH Score/Interventions Latch: Grasps breast easily, tongue down, lips flanged, rhythmical sucking. Intervention(s): Breast massage;Adjust position  Audible Swallowing: Spontaneous and intermittent Intervention(s): Skin to skin;Hand expression;Alternate breast massage  Type of Nipple: Everted at rest and after stimulation  Comfort (Breast/Nipple): Soft / non-tender     Hold (Positioning): Assistance needed to correctly position infant at breast and maintain latch. Intervention(s): Breastfeeding basics reviewed;Support Pillows;Position options;Skin to skin  LATCH Score: 9  Lactation Tools Discussed/Used     Consult Status Consult Status: Follow-up Date: 03/23/13 Follow-up type: In-patient    Dahlia ByesBerkelhammer, Shalece Staffa [facility] 03/22/2013, 11:48 PM

## 2013-03-22 NOTE — Op Note (Signed)
03/21/2013 - 03/22/2013  11:25 AM  PATIENT:  Marissa SpiceSebrina A Lubas  33 y.o. female  PRE-OPERATIVE DIAGNOSIS:  Non-reassuring FHR, fetal intolerance of labor remote from vaginal delivery  POST-OPERATIVE DIAGNOSIS:  Non-reassuring FHR, fetal intolerance of labor remote from vaginal delivery  PROCEDURE:  Procedure(s): CESAREAN SECTION (N/A)  SURGEON:  Surgeon(s) and Role:    * Loney LaurenceMichelle A Rayaan Garguilo, MD - Primary  ANESTHESIA:   epidural  EBL:  Total I/O In: 1200 [I.V.:1200] Out: 1200 [Urine:400; Blood:800]   SPECIMEN:  Source of Specimen:  placenta  DISPOSITION OF SPECIMEN:  PATHOLOGY  COUNTS:  YES Complications:  none Medications:  Ancef, Pitocin Findings:  Baby female, Apgars 8,9, weight P.   Normal tubes, ovaries and uterus seen.  Baby was skin to skin with mother after birth in the OR.  Technique:  After adequate epidural anesthesia was achieved, the patient was prepped and draped in usual sterile fashion.  A foley catheter was used to drain the bladder.  A pfannanstiel incision was made with the scalpel and carried down to the fascia with the bovie cautery. The fascia was incised in the midline with the scalpel and carried in a transverse curvilinear manner bilaterally.  The fascia was reflected superiorly and inferiorly off the rectus muscles and the muscles split in the midline.  A bowel free portion of the peritoneum was entered bluntly and then extended in a superior and inferior manner with good visualization of the bowel and bladder.  The Alexis instrument was then placed and the vesico-uterine fascia tented up and incised in a transverse curvilinear manner.  A 2 cm transverse incision was made in the upper portion of the lower uterine segment until the amnion was exposed.   The incision was extended transversely in a blunt manner.  Thick mec fluid was noted and the baby delivered in the vertex presentation without complication.  The baby was vigorous from birth and was bulb suctioned  and the cord was clamped and cut.  The baby was then handed to awaiting Neonatology.  The placenta was then delivered manually and the uterus cleared of all debris.  The uterine incision was then closed with a running lock stitch of 0 monocryl.  An imbricating layer of 0 monocryl was closed as well. A third layer of figure of eight stitches was used to ensure hemostasis and reinforce the tissue.  Good hemostasis of the uterine incision was achieved and the abdomen was cleared with irrigation.  The peritoneum was closed with a running stitch of 2-0 vicryl.  This incorporated the rectus muscles as a separate layer.  The fascia was then closed with a running stitch of 0 vicryl.  The subcutaneous layer was closed with interrupted  stitches of 2-0 plain gut.  The skin was closed with keith needle 4-0 vicryl and steristrips.  The patient tolerated the procedure well and was returned to the recovery room in stable condition.  All counts were correct times three.  Moksh Loomer A

## 2013-03-22 NOTE — Progress Notes (Signed)
Cord gas was 7.33.

## 2013-03-22 NOTE — Brief Op Note (Signed)
03/21/2013 - 03/22/2013  11:25 AM  PATIENT:  Marissa Kline  32 y.o. female  PRE-OPERATIVE DIAGNOSIS:  Non-reassuring FHR, fetal intolerance of labor remote from vaginal delivery  POST-OPERATIVE DIAGNOSIS:  Non-reassuring FHR, fetal intolerance of labor remote from vaginal delivery  PROCEDURE:  Procedure(s): CESAREAN SECTION (N/A)  SURGEON:  Surgeon(s) and Role:    * Ezekeil Bethel A Arieal Cuoco, MD - Primary  ANESTHESIA:   epidural  EBL:  Total I/O In: 1200 [I.V.:1200] Out: 1200 [Urine:400; Blood:800]   SPECIMEN:  Source of Specimen:  placenta  DISPOSITION OF SPECIMEN:  PATHOLOGY  COUNTS:  YES Complications:  none Medications:  Ancef, Pitocin Findings:  Baby female, Apgars 8,9, weight P.   Normal tubes, ovaries and uterus seen.  Baby was skin to skin with mother after birth in the OR.  Technique:  After adequate epidural anesthesia was achieved, the patient was prepped and draped in usual sterile fashion.  A foley catheter was used to drain the bladder.  A pfannanstiel incision was made with the scalpel and carried down to the fascia with the bovie cautery. The fascia was incised in the midline with the scalpel and carried in a transverse curvilinear manner bilaterally.  The fascia was reflected superiorly and inferiorly off the rectus muscles and the muscles split in the midline.  A bowel free portion of the peritoneum was entered bluntly and then extended in a superior and inferior manner with good visualization of the bowel and bladder.  The Alexis instrument was then placed and the vesico-uterine fascia tented up and incised in a transverse curvilinear manner.  A 2 cm transverse incision was made in the upper portion of the lower uterine segment until the amnion was exposed.   The incision was extended transversely in a blunt manner.  Thick mec fluid was noted and the baby delivered in the vertex presentation without complication.  The baby was vigorous from birth and was bulb suctioned  and the cord was clamped and cut.  The baby was then handed to awaiting Neonatology.  The placenta was then delivered manually and the uterus cleared of all debris.  The uterine incision was then closed with a running lock stitch of 0 monocryl.  An imbricating layer of 0 monocryl was closed as well. A third layer of figure of eight stitches was used to ensure hemostasis and reinforce the tissue.  Good hemostasis of the uterine incision was achieved and the abdomen was cleared with irrigation.  The peritoneum was closed with a running stitch of 2-0 vicryl.  This incorporated the rectus muscles as a separate layer.  The fascia was then closed with a running stitch of 0 vicryl.  The subcutaneous layer was closed with interrupted  stitches of 2-0 plain gut.  The skin was closed with keith needle 4-0 vicryl and steristrips.  The patient tolerated the procedure well and was returned to the recovery room in stable condition.  All counts were correct times three.  Holden Draughon A    

## 2013-03-23 LAB — CBC
HCT: 23.1 % — ABNORMAL LOW (ref 36.0–46.0)
Hemoglobin: 7.8 g/dL — ABNORMAL LOW (ref 12.0–15.0)
MCH: 26.6 pg (ref 26.0–34.0)
MCHC: 33.3 g/dL (ref 30.0–36.0)
MCV: 79.7 fL (ref 78.0–100.0)
PLATELETS: 187 10*3/uL (ref 150–400)
RBC: 2.9 MIL/uL — ABNORMAL LOW (ref 3.87–5.11)
RDW: 14.6 % (ref 11.5–15.5)
WBC: 13.6 10*3/uL — ABNORMAL HIGH (ref 4.0–10.5)

## 2013-03-23 NOTE — Discharge Summary (Signed)
Obstetric Discharge Summary Reason for Admission: induction of labor Prenatal Procedures: NST Intrapartum Procedures: cesarean: low cervical, transverse Postpartum Procedures: none Complications-Operative and Postpartum: none Hemoglobin  Date Value Ref Range Status  03/23/2013 7.8* 12.0 - 15.0 g/dL Final     REPEATED TO VERIFY     DELTA CHECK NOTED  03/05/2013 10.0* 12.2 - 16.2 g/dL Final     HCT  Date Value Ref Range Status  03/23/2013 23.1* 36.0 - 46.0 % Final     HCT, POC  Date Value Ref Range Status  03/05/2013 32.5* 37.7 - 47.9 % Final     Discharge Diagnoses: Term Pregnancy-delivered  Discharge Information: Date: 03/23/2013 Activity: pelvic rest Diet: routine Medications: Ibuprofen, Iron and Percocet Condition: stable Instructions: refer to practice specific booklet Discharge to: home Follow-up Information   Follow up with Teon Hudnall A, MD In 2 weeks.   Specialty:  Obstetrics and Gynecology   Contact information:   [address] [place] [id number] [phone]       Newborn Data: Live born female  Birth Weight: 7 lb 3.5 oz (3275 g) APGAR: 8, 9  Home with mother.  Graviela Nodal A 03/23/2013, 9:38 AM

## 2013-03-23 NOTE — Progress Notes (Signed)
  Patient is eating, ambulating, voiding.  Pain control is good.  Filed Vitals:   03/22/13 2118 03/22/13 2239 03/23/13 0300 03/23/13 0553  BP: 110/62 112/70 109/65 107/61  Pulse: 62 75 84 77  Temp: 97.9 F (36.6 C) 97.6 F (36.4 C) 98.5 F (36.9 C) 97.4 F (36.3 C)  TempSrc: Oral Oral Oral Oral  Resp: 18 20 18 20   Height:      Weight:      SpO2: 98% 97% 100% 98%    lungs:   clear to auscultation cor:    RRR Abdomen:  soft, appropriate tenderness, incisions intact and without erythema or exudate ex:    no cords   Lab Results  Component Value Date   WBC 13.6* 03/23/2013   HGB 7.8* 03/23/2013   HCT 23.1* 03/23/2013   MCV 79.7 03/23/2013   PLT 187 03/23/2013    --/--/A POS, A POS (02/12 2101)/RI  A/P    Post operative day 1.  Routine post op and postpartum care.  Expect d/c tomorrrow.  Percocet for pain control. Iron for anemia.

## 2013-03-24 MED ORDER — OXYCODONE-ACETAMINOPHEN 5-325 MG PO TABS: 1.0000 | ORAL_TABLET | ORAL | Status: DC | PRN

## 2013-03-24 NOTE — Progress Notes (Signed)
  Patient is eating, ambulating, voiding.  Pain control is good.  Filed Vitals:   03/23/13 0300 03/23/13 0553 03/23/13 1100 03/23/13 1845  BP: 109/65 107/61 107/64 114/79  Pulse: 84 77 76 72  Temp: 98.5 F (36.9 C) 97.4 F (36.3 C) 98.5 F (36.9 C) 98.2 F (36.8 C)  TempSrc: Oral Oral Oral Oral  Resp: 18 20 18 18   Height:      Weight:      SpO2: 100% 98% 98%     lungs:   clear to auscultation cor:    RRR Abdomen:  soft, appropriate tenderness, incisions intact and without erythema or exudate ex:    no cords   Lab Results  Component Value Date   WBC 13.6* 03/23/2013   HGB 7.8* 03/23/2013   HCT 23.1* 03/23/2013   MCV 79.7 03/23/2013   PLT 187 03/23/2013    --/--/A POS, A POS (02/12 2101)/RI  A/P    Post operative day 2.  Routine post op and postpartum care.  Expect d/c today.  Percocet for pain control. Yesterday- Parents desires circumsision.  All risks, benefits and alternatives discussed with the mother and circ was done.

## 2013-03-25 ENCOUNTER — Encounter (HOSPITAL_COMMUNITY): Payer: Self-pay | Admitting: Obstetrics and Gynecology

## 2013-04-01 ENCOUNTER — Ambulatory Visit (HOSPITAL_COMMUNITY)
Admission: RE | Admit: 2013-04-01 | Discharge: 2013-04-01 | Disposition: A | Discharge status: Home / Self Care | Payer: 59 | Source: Ambulatory Visit | Attending: Obstetrics and Gynecology | Admitting: Obstetrics and Gynecology

## 2013-04-01 NOTE — Lactation Note (Signed)
Infant Lactation Consultation Outpatient Visit Note  Patient Name: Benay SpiceSebrina A Mone Date of Birth: Aug 06, 1980   Gestational Age at Delivery: Gestational Age: <None> Type of Delivery: C/section 03/22/2013  Birth weight - 7-3 oz  DS[facility] weight - 6-11 oz  1st Dr. Visit - 7-1 oz  Today's weight - 7-6.7 oz 3366 g  Reason for visit per mom - need help for depth at the breast and set up with DEBP  Breastfeeding History- per mom breast feeding has gone well right from the start. Just need help getting him on closer.  Frequency of Breastfeeding: per mom every 2- 3 hours  Length of Feeding: 15 -20 mins both breast  Voids: >6  Stools: >2-3 yellowish seedy stools   Supplementing / Method: Pumping:  Type of Pump: DEBP Ameda    Frequency:   Volume:    Comments:- mom brought the pump to consult to be shown how to use it for the first time , flange standard a good fit bilaterally. Per mom comfortable. LC showed mom how to set the DEBP up and mom pumped for less than 5 minutes to check flanges.    Consultation Evaluation: both breast full , no engorgement or plugged ducts noted, nipples both  healthy no breakdown noted. Mom denies sore ness or engorgement issues. Baby appears healthy, calm with feeding cues The entire consult . No rashes , well hydrated with moist mucous membranes.  Initial Feeding Assessment:right breast , football  Pre-feed Weight: 7-6.7 oz 3366g  Post-feed Weight: 7-8.6 oz 3420 g  Amount Transferred:54 ml  Comments: Reviewed basics - worked on depth, baby opens wide and stayed in a consistent   swallowing pattern for for 12 mins , with multiply swallows and gulps , increased with breast compressions, Per mom comfortable and nipple appeared normal  when baby released.  Additional Feeding Assessment: left breast ( cross cradle )  Pre-feed Weight: 7-8.6 oz 3420 g  Post-feed Weight: 7-9.4 oz 3442 g  Amount Transferred:22 ml  Comments: worked on depth , latched well with  multiply swallows , increased with breast compressions. Per mom comfortable and nipple appeared normal when baby released.   Additional Feeding Assessment: left breast ( football )  Pre-feed Weight:7-9.4 oz 3442 g  Post-feed Weight:7-9.8 oz 3454 g  Amount Transferred: 12 ml  Comments:re-latched onto the same breast , switching positions, multiply swallows noted, Mom able to obtain depth and per mom comfortable.   Total Breast milk Transferred this Visit: 88 ml  Total Supplement Given: none   Comments - Baby Nolan latched well , working on depth helped , and transferred 88 ml off at 10 days out - excellent!  Lactation Plan of Care - Praised mom for her efforts breast feeding                                       - Mom - enc. Rest, naps ,plenty fluids, especially water, nutritious snacks and meals                                       - Steps for latching breast massage, hand express , pre pump if to full , latch with breast compressions  And then intermittent with feeding.                                      - Continue to alterate positions , enhances milk supply.                                      - Soften the 1st breast prior to switching to the 2nd  Breast .                    Important - If Lonni Fix doesn't feed on 2nd  Breast release down with hand expressing or pumping to comfort to protect milk supply.                                    - Transitioning back to work between 4-6  Weeks , introducing a bottle    Follow-Up- Per mom 3/13 with Dr. Alita Chyle                    - Per mom Smart start at home 2/24 at home                    - Consider attending the breast feeding support group Monday evenings at 7pm or Tuesday's at 11am .       Kathrin Greathouse 04/01/2013, 11:03 AM

## 2013-05-09 ENCOUNTER — Other Ambulatory Visit: Payer: Self-pay | Admitting: Obstetrics and Gynecology

## 2013-06-30 ENCOUNTER — Ambulatory Visit (INDEPENDENT_AMBULATORY_CARE_PROVIDER_SITE_OTHER): Payer: 59 | Admitting: Family Medicine

## 2013-06-30 VITALS — BP 118/60 | HR 90 | Temp 98.4°F | Ht 61.0 in | Wt 145.0 lb

## 2013-06-30 DIAGNOSIS — M778 Other enthesopathies, not elsewhere classified: Secondary | ICD-10-CM

## 2013-06-30 DIAGNOSIS — M65839 Other synovitis and tenosynovitis, unspecified forearm: Secondary | ICD-10-CM

## 2013-06-30 DIAGNOSIS — M25532 Pain in left wrist: Secondary | ICD-10-CM

## 2013-06-30 DIAGNOSIS — M25539 Pain in unspecified wrist: Secondary | ICD-10-CM

## 2013-06-30 DIAGNOSIS — M65849 Other synovitis and tenosynovitis, unspecified hand: Secondary | ICD-10-CM

## 2013-06-30 MED ORDER — PREDNISONE 20 MG PO TABS: ORAL_TABLET | ORAL | Status: DC

## 2013-06-30 NOTE — Progress Notes (Signed)
Is a 33 year old woman with a sore left wrist for the last 2-3 weeks. It began in conjunction with taking care of her newborn who is now 13 weeks. She has pain with flexion, extension, supination and rotation of the left wrist.  Is been no swelling, no history of trauma.  Objective: Alert articulate woman in no distress Examination left wrist reveals normal bony architecture, no swelling, full range of motion.  Assessment: Tendinitis left wrist from overuse  Tendonitis of wrist, left - Plan: predniSONE (DELTASONE) 20 MG tablet  Wrist pain, left - Plan: predniSONE (DELTASONE) 20 MG tablet  Signed, Elvina Sidle, MD

## 2014-05-13 ENCOUNTER — Other Ambulatory Visit: Payer: Self-pay | Admitting: Obstetrics and Gynecology

## 2014-05-15 LAB — CYTOLOGY - PAP

## 2014-05-22 ENCOUNTER — Telehealth: Payer: Self-pay | Admitting: Internal Medicine

## 2014-05-22 NOTE — Telephone Encounter (Signed)
Left a message for patient to call back. 

## 2014-05-27 NOTE — Telephone Encounter (Signed)
Spoke with patient and she is having an increase in IBS symptoms such as cramping and diarrhea. She would like to schedule an OV. Scheduled with Willette ClusterPaula Guenther, NP on 06/02/14 at 1:30 PM.

## 2014-05-27 NOTE — Telephone Encounter (Signed)
Left a message for patient to call back is she still has concerns.

## 2014-06-02 ENCOUNTER — Encounter: Payer: Self-pay | Admitting: Nurse Practitioner

## 2014-06-02 ENCOUNTER — Ambulatory Visit (INDEPENDENT_AMBULATORY_CARE_PROVIDER_SITE_OTHER): Payer: 59 | Admitting: Nurse Practitioner

## 2014-06-02 VITALS — BP 100/60 | HR 84 | Ht 61.5 in | Wt 150.1 lb

## 2014-06-02 DIAGNOSIS — K589 Irritable bowel syndrome without diarrhea: Secondary | ICD-10-CM | POA: Diagnosis not present

## 2014-06-02 DIAGNOSIS — Z789 Other specified health status: Secondary | ICD-10-CM | POA: Insufficient documentation

## 2014-06-02 MED ORDER — DICYCLOMINE HCL 10 MG PO CAPS: 10.0000 mg | ORAL_CAPSULE | Freq: Two times a day (BID) | ORAL | Status: DC | PRN

## 2014-06-02 NOTE — Patient Instructions (Addendum)
Please review FODMAP diet  We have sent the following medications to your pharmacy for you to pick up at your convenience: Bentyl

## 2014-06-02 NOTE — Progress Notes (Signed)
    HPI :   Patient is a 34 year old female known to Dr. Juanda ChanceBrodie. She has a history of irritable bowel syndrome. Patient has done well for several years on Bentyl and MiraLAX taken as needed.   Patient having altered bowel habits again. She goes a day or two without a bowel movement then has either a normal solid stool or crampy diarrhea. Crampy diarrhea is always in the evening, occurs about once a week and she cannot relate it to any certain foods. She does not take herbal products,  her only medication is birth control pills. No associated urgency or incontinence. No rectal bleeding.    Past Medical History  Diagnosis Date  . Irritable bowel syndrome     Family History  Problem Relation Age of Onset  . Diabetes Mother   . Hypertension Mother   . Cancer Father   . Hypertension Father   . Hyperlipidemia Father   . Hyperlipidemia Sister   . Hypertension Sister   . Diabetes Sister   . Cancer Maternal Grandmother   . Hyperlipidemia Sister    History  Substance Use Topics  . Smoking status: Never Smoker   . Smokeless tobacco: Never Used  . Alcohol Use: No   Current Outpatient Prescriptions  Medication Sig Dispense Refill  . SPRINTEC 28 0.25-35 MG-MCG tablet Take 1 tablet by mouth daily.     No current facility-administered medications for this visit.   No Known Allergies   Review of Systems: As of her allergy/sinus trouble . All other systems reviewed and negative except where noted in HPI.   Physical Exam: BP 100/60 mmHg  Pulse 84  Ht 5' 1.5" (1.562 m)  Wt 150 lb 2 oz (68.096 kg)  BMI 27.91 kg/m2  Breastfeeding? No Constitutional: Pleasant,well-developed, black female in no acute distress. HEENT: Normocephalic and atraumatic. Conjunctivae are normal. No scleral icterus. Neck supple.  Cardiovascular: Normal rate, regular rhythm.  Pulmonary/chest: Effort normal and breath sounds normal. No wheezing, rales or rhonchi. Abdominal: Soft, nondistended, nontender. Bowel  sounds active throughout. There are no masses palpable. No hepatomegaly. Extremities: no edema Lymphadenopathy: No cervical adenopathy noted. Neurological: Alert and oriented to person place and time. Skin: Skin is warm and dry. No rashes noted. Psychiatric: Normal mood and affect. Behavior is normal.   ASSESSMENT AND PLAN:  34 year old female with irritable bowel syndrome. Patient has crampy diarrhea about once a week for unclear reasons. Bowels are otherwise normal except for occasional constipation for which patient takes MiraLAX. I do not think she is having overflow diarrhea. Dicyclomine worked for the cramps in the past, we will refill it. Gave patient a copy of the FODMAP diet, perhaps she is consuming something which is causing the bowel changes. Patient looks well, weight stable, abdominal exam benign. Follow-up when necessary.

## 2014-06-04 NOTE — Progress Notes (Signed)
Reviewed. Sprue profile was negative in 2009. Agree with an antispasmodic. Consider  Amitiza.

## 2015-02-12 ENCOUNTER — Other Ambulatory Visit: Payer: Self-pay | Admitting: Gastroenterology

## 2016-06-03 ENCOUNTER — Other Ambulatory Visit: Payer: Self-pay | Admitting: Obstetrics and Gynecology

## 2016-06-03 DIAGNOSIS — Z01419 Encounter for gynecological examination (general) (routine) without abnormal findings: Secondary | ICD-10-CM | POA: Diagnosis not present

## 2016-06-08 LAB — CYTOLOGY - PAP

## 2016-12-12 DIAGNOSIS — Z0000 Encounter for general adult medical examination without abnormal findings: Secondary | ICD-10-CM | POA: Diagnosis not present

## 2016-12-12 DIAGNOSIS — E7800 Pure hypercholesterolemia, unspecified: Secondary | ICD-10-CM | POA: Diagnosis not present

## 2016-12-12 DIAGNOSIS — Z23 Encounter for immunization: Secondary | ICD-10-CM | POA: Diagnosis not present

## 2017-05-22 DIAGNOSIS — J018 Other acute sinusitis: Secondary | ICD-10-CM | POA: Diagnosis not present

## 2017-06-14 DIAGNOSIS — Z124 Encounter for screening for malignant neoplasm of cervix: Secondary | ICD-10-CM | POA: Diagnosis not present

## 2017-06-14 DIAGNOSIS — Z01419 Encounter for gynecological examination (general) (routine) without abnormal findings: Secondary | ICD-10-CM | POA: Diagnosis not present

## 2017-06-19 ENCOUNTER — Other Ambulatory Visit: Payer: Self-pay | Admitting: Obstetrics and Gynecology

## 2017-06-19 DIAGNOSIS — N644 Mastodynia: Secondary | ICD-10-CM

## 2017-07-06 ENCOUNTER — Ambulatory Visit
Admission: RE | Admit: 2017-07-06 | Discharge: 2017-07-06 | Disposition: A | Discharge status: Home / Self Care | Payer: 59 | Source: Ambulatory Visit | Attending: Obstetrics and Gynecology | Admitting: Obstetrics and Gynecology

## 2017-07-06 ENCOUNTER — Ambulatory Visit: Payer: 59

## 2017-07-06 DIAGNOSIS — N644 Mastodynia: Secondary | ICD-10-CM

## 2017-07-06 DIAGNOSIS — R922 Inconclusive mammogram: Secondary | ICD-10-CM | POA: Diagnosis not present

## 2017-09-12 DIAGNOSIS — Z3480 Encounter for supervision of other normal pregnancy, unspecified trimester: Secondary | ICD-10-CM | POA: Diagnosis not present

## 2017-09-12 DIAGNOSIS — N925 Other specified irregular menstruation: Secondary | ICD-10-CM | POA: Diagnosis not present

## 2017-09-29 DIAGNOSIS — R5383 Other fatigue: Secondary | ICD-10-CM | POA: Diagnosis not present

## 2017-09-29 DIAGNOSIS — O09521 Supervision of elderly multigravida, first trimester: Secondary | ICD-10-CM | POA: Diagnosis not present

## 2017-09-29 DIAGNOSIS — Z3480 Encounter for supervision of other normal pregnancy, unspecified trimester: Secondary | ICD-10-CM | POA: Diagnosis not present

## 2017-10-02 LAB — OB RESULTS CONSOLE ABO/RH: RH Type: POSITIVE

## 2017-10-02 LAB — OB RESULTS CONSOLE RUBELLA ANTIBODY, IGM: RUBELLA: IMMUNE

## 2017-10-02 LAB — OB RESULTS CONSOLE GC/CHLAMYDIA
CHLAMYDIA, DNA PROBE: NEGATIVE
GC PROBE AMP, GENITAL: NEGATIVE

## 2017-10-02 LAB — OB RESULTS CONSOLE ANTIBODY SCREEN: Antibody Screen: NEGATIVE

## 2017-10-02 LAB — OB RESULTS CONSOLE HEPATITIS B SURFACE ANTIGEN: Hepatitis B Surface Ag: NEGATIVE

## 2017-10-02 LAB — OB RESULTS CONSOLE HIV ANTIBODY (ROUTINE TESTING): HIV: NONREACTIVE

## 2017-10-02 LAB — OB RESULTS CONSOLE RPR: RPR: NONREACTIVE

## 2017-11-07 DIAGNOSIS — Z3482 Encounter for supervision of other normal pregnancy, second trimester: Secondary | ICD-10-CM | POA: Diagnosis not present

## 2017-11-30 DIAGNOSIS — Z23 Encounter for immunization: Secondary | ICD-10-CM | POA: Diagnosis not present

## 2018-01-22 DIAGNOSIS — Z3480 Encounter for supervision of other normal pregnancy, unspecified trimester: Secondary | ICD-10-CM | POA: Diagnosis not present

## 2018-01-22 DIAGNOSIS — Z23 Encounter for immunization: Secondary | ICD-10-CM | POA: Diagnosis not present

## 2018-02-26 DIAGNOSIS — M79671 Pain in right foot: Secondary | ICD-10-CM | POA: Diagnosis not present

## 2018-02-26 DIAGNOSIS — M79642 Pain in left hand: Secondary | ICD-10-CM | POA: Diagnosis not present

## 2018-03-13 DIAGNOSIS — D509 Iron deficiency anemia, unspecified: Secondary | ICD-10-CM | POA: Diagnosis not present

## 2018-03-19 DIAGNOSIS — D509 Iron deficiency anemia, unspecified: Secondary | ICD-10-CM | POA: Diagnosis not present

## 2018-03-23 DIAGNOSIS — O09523 Supervision of elderly multigravida, third trimester: Secondary | ICD-10-CM | POA: Diagnosis not present

## 2018-03-27 DIAGNOSIS — D509 Iron deficiency anemia, unspecified: Secondary | ICD-10-CM | POA: Diagnosis not present

## 2018-03-27 DIAGNOSIS — Z3400 Encounter for supervision of normal first pregnancy, unspecified trimester: Secondary | ICD-10-CM | POA: Diagnosis not present

## 2018-03-29 DIAGNOSIS — Z3480 Encounter for supervision of other normal pregnancy, unspecified trimester: Secondary | ICD-10-CM | POA: Diagnosis not present

## 2018-03-29 DIAGNOSIS — Z369 Encounter for antenatal screening, unspecified: Secondary | ICD-10-CM | POA: Diagnosis not present

## 2018-03-29 DIAGNOSIS — O09523 Supervision of elderly multigravida, third trimester: Secondary | ICD-10-CM | POA: Diagnosis not present

## 2018-03-29 LAB — OB RESULTS CONSOLE GBS: GBS: NEGATIVE

## 2018-04-03 ENCOUNTER — Telehealth (HOSPITAL_COMMUNITY): Payer: Self-pay | Admitting: *Deleted

## 2018-04-03 ENCOUNTER — Encounter (HOSPITAL_COMMUNITY): Payer: Self-pay | Admitting: *Deleted

## 2018-04-03 DIAGNOSIS — O09523 Supervision of elderly multigravida, third trimester: Secondary | ICD-10-CM | POA: Diagnosis not present

## 2018-04-03 NOTE — Telephone Encounter (Signed)
Preadmission screen  

## 2018-04-12 DIAGNOSIS — O09523 Supervision of elderly multigravida, third trimester: Secondary | ICD-10-CM | POA: Diagnosis not present

## 2018-04-14 ENCOUNTER — Other Ambulatory Visit: Payer: Self-pay | Admitting: Obstetrics and Gynecology

## 2018-04-14 NOTE — H&P (Signed)
38 y.o.  G3P1010 [redacted]w[redacted]d comes in for a repeat cesarean section at term.  Patient has good fetal movement and no bleeding.    Past Medical History:  Diagnosis Date  . Allergy history unknown   . AMA (advanced maternal age) multigravida 35+   . Anemia   . Irritable bowel syndrome     Past Surgical History:  Procedure Laterality Date  . CESAREAN SECTION N/A 03/22/2013   Procedure: CESAREAN SECTION;  Surgeon: [name], MD;  Location: [facility];  Service: Obstetrics;  Laterality: N/A;    OB History  Gravida Para Term Preterm AB Living  3 1 1  0 1 0  SAB TAB Ectopic Multiple Live Births  0 1 0 0      # Outcome Date GA Lbr Len/2nd Weight Sex Delivery Anes PTL Lv  3 Current           2 Term 03/22/13 [redacted]w[redacted]d   M CS-LTranv EPI    1 TAB             Social History   Socioeconomic History  . Marital status: Married    Spouse name: Not on file  . Number of children: Not on file  . Years of education: Not on file  . Highest education level: Not on file  Occupational History  . Not on file  Social Needs  . Financial resource strain: Not hard at all  . Food insecurity:    Worry: Never true    Inability: Never true  . Transportation needs:    Medical: No    Non-medical: Not on file  Tobacco Use  . Smoking status: Never Smoker  . Smokeless tobacco: Never Used  Substance and Sexual Activity  . Alcohol use: No  . Drug use: No  . Sexual activity: Yes    Birth control/protection: Pill  Lifestyle  . Physical activity:    Days per week: Not on file    Minutes per session: Not on file  . Stress: Only a little  Relationships  . Social connections:    Talks on phone: Not on file    Gets together: Not on file    Attends religious service: Not on file    Active member of club or organization: Not on file    Attends meetings of clubs or organizations: Not on file    Relationship status: Not on file  . Intimate partner violence:    Fear of current or ex partner: No    Emotionally  abused: No    Physically abused: No    Forced sexual activity: No  Other Topics Concern  . Not on file  Social History Narrative  . Not on file   Patient has no known allergies.   Prenatal Course: uncomplicated.    Prenatal Transfer Tool  Maternal Diabetes: No Genetic Screening: NormalNIPT low risk female. Maternal Ultrasounds/Referrals: Normal Fetal Ultrasounds or other Referrals:  None Maternal Substance Abuse:  No Significant Maternal Medications:  Meds include: Other: Iron infusion on several times.  Significant Maternal Lab Results: Lab values include: Other: anemia  There were no vitals filed for this visit.  Lungs/Cor:  NAD Abdomen:  soft, gravid Ex:  no cords, erythema SVE:  NA FHTs:  Present.  A/P   For repeat cesarean sectionat term.  All risks, benefits and alternatives discussed with patient and she desires to proceed.  Desires permenant sterilization.  Will get labs on Monday instead of same day to make sure H/H is high  enough.   [name]

## 2018-04-16 ENCOUNTER — Encounter (HOSPITAL_COMMUNITY)
Admission: RE | Admit: 2018-04-16 | Discharge: 2018-04-16 | Disposition: A | Discharge status: Home / Self Care | Payer: 59 | Source: Ambulatory Visit | Attending: Obstetrics and Gynecology | Admitting: Obstetrics and Gynecology

## 2018-04-16 DIAGNOSIS — Z01812 Encounter for preprocedural laboratory examination: Secondary | ICD-10-CM

## 2018-04-16 HISTORY — DX: Supervision of elderly multigravida, unspecified trimester: O09.529

## 2018-04-16 HISTORY — DX: Anemia, unspecified: D64.9

## 2018-04-16 LAB — CBC
HCT: 31.3 % — ABNORMAL LOW (ref 36.0–46.0)
Hemoglobin: 9.9 g/dL — ABNORMAL LOW (ref 12.0–15.0)
MCH: 27.3 pg (ref 26.0–34.0)
MCHC: 31.6 g/dL (ref 30.0–36.0)
MCV: 86.5 fL (ref 80.0–100.0)
PLATELETS: 169 10*3/uL (ref 150–400)
RBC: 3.62 MIL/uL — ABNORMAL LOW (ref 3.87–5.11)
RDW: 17.4 % — ABNORMAL HIGH (ref 11.5–15.5)
WBC: 7.4 10*3/uL (ref 4.0–10.5)
nRBC: 0 % (ref 0.0–0.2)

## 2018-04-16 LAB — TYPE AND SCREEN
ABO/RH(D): A POS
Antibody Screen: NEGATIVE

## 2018-04-16 LAB — ABO/RH: ABO/RH(D): A POS

## 2018-04-16 NOTE — Patient Instructions (Signed)
Marissa Kline  04/16/2018   Your procedure is scheduled on:  04/17/2018  Arrive at 1030 at Entrance C on [address] at [facility] and [employer]. You are invited to use the FREE valet parking or use the Visitor's parking deck.  Pick up the phone at the desk and dial [phone].  Call this number if you have problems the morning of surgery: [phone]  Remember:   Do not eat food:(After Midnight) Desps de medianoche.  Do not drink clear liquids: (After Midnight) Desps de medianoche.  Take these medicines the morning of surgery with A SIP OF WATER:  May take prilosec and pepcid   Do not wear jewelry, make-up or nail polish.  Do not wear lotions, powders, or perfumes. Do not wear deodorant.  Do not shave 48 hours prior to surgery.  Do not bring valuables to the hospital.  [facility] is not   responsible for any belongings or valuables brought to the hospital.  Contacts, dentures or bridgework may not be worn into surgery.  Leave suitcase in the car. After surgery it may be brought to your room.  For patients admitted to the hospital, checkout time is 11:00 AM the day of              discharge.      Please read over the following fact sheets that you were given:     Preparing for Surgery

## 2018-04-17 ENCOUNTER — Inpatient Hospital Stay (HOSPITAL_COMMUNITY): Payer: 59 | Admitting: Anesthesiology

## 2018-04-17 ENCOUNTER — Encounter (HOSPITAL_COMMUNITY)
Admission: RE | Disposition: A | Discharge status: Home / Self Care | Payer: Self-pay | Source: Home / Self Care | Attending: Obstetrics and Gynecology

## 2018-04-17 ENCOUNTER — Encounter (HOSPITAL_COMMUNITY): Payer: Self-pay | Admitting: *Deleted

## 2018-04-17 ENCOUNTER — Other Ambulatory Visit: Payer: Self-pay

## 2018-04-17 ENCOUNTER — Inpatient Hospital Stay (HOSPITAL_COMMUNITY)
Admission: RE | Admit: 2018-04-17 | Discharge: 2018-04-19 | DRG: 785 | Disposition: A | Discharge status: Home / Self Care | Payer: 59 | Attending: Obstetrics and Gynecology | Admitting: Obstetrics and Gynecology

## 2018-04-17 DIAGNOSIS — Z3A00 Weeks of gestation of pregnancy not specified: Secondary | ICD-10-CM | POA: Diagnosis not present

## 2018-04-17 DIAGNOSIS — O34211 Maternal care for low transverse scar from previous cesarean delivery: Secondary | ICD-10-CM | POA: Diagnosis not present

## 2018-04-17 DIAGNOSIS — Z9889 Other specified postprocedural states: Secondary | ICD-10-CM

## 2018-04-17 DIAGNOSIS — D649 Anemia, unspecified: Secondary | ICD-10-CM | POA: Diagnosis present

## 2018-04-17 DIAGNOSIS — Z302 Encounter for sterilization: Secondary | ICD-10-CM

## 2018-04-17 DIAGNOSIS — O9902 Anemia complicating childbirth: Secondary | ICD-10-CM | POA: Diagnosis present

## 2018-04-17 DIAGNOSIS — O34219 Maternal care for unspecified type scar from previous cesarean delivery: Secondary | ICD-10-CM | POA: Diagnosis not present

## 2018-04-17 DIAGNOSIS — Z3A38 38 weeks gestation of pregnancy: Secondary | ICD-10-CM | POA: Diagnosis not present

## 2018-04-17 LAB — RPR: RPR Ser Ql: NONREACTIVE

## 2018-04-17 SURGERY — Surgical Case
Anesthesia: Spinal | Site: Abdomen | Laterality: Bilateral | Wound class: Clean Contaminated

## 2018-04-17 MED ORDER — SCOPOLAMINE 1 MG/3DAYS TD PT72
1.0000 | MEDICATED_PATCH | TRANSDERMAL | Status: DC
  Administered 2018-04-17: 1.5 mg via TRANSDERMAL

## 2018-04-17 MED ORDER — MORPHINE SULFATE (PF) 0.5 MG/ML IJ SOLN
INTRAMUSCULAR | Status: AC
  Filled 2018-04-17: qty 10

## 2018-04-17 MED ORDER — HYDROMORPHONE HCL 1 MG/ML IJ SOLN: 0.2500 mg | INTRAMUSCULAR | Status: DC | PRN

## 2018-04-17 MED ORDER — DEXAMETHASONE SODIUM PHOSPHATE 4 MG/ML IJ SOLN
4.0000 mg | INTRAMUSCULAR | Status: AC
  Administered 2018-04-17: 4 mg via INTRAVENOUS

## 2018-04-17 MED ORDER — ACETAMINOPHEN 500 MG PO TABS
1000.0000 mg | ORAL_TABLET | ORAL | Status: AC
  Administered 2018-04-17: 1000 mg via ORAL

## 2018-04-17 MED ORDER — PHENYLEPHRINE HCL-NACL 20-0.9 MG/250ML-% IV SOLN
INTRAVENOUS | Status: DC | PRN
  Administered 2018-04-17: 60 ug/min via INTRAVENOUS

## 2018-04-17 MED ORDER — PANTOPRAZOLE SODIUM 40 MG PO TBEC
40.0000 mg | DELAYED_RELEASE_TABLET | Freq: Every day | ORAL | Status: DC
  Administered 2018-04-18 – 2018-04-19 (×2): 40 mg via ORAL
  Filled 2018-04-17 (×2): qty 1

## 2018-04-17 MED ORDER — METHYLERGONOVINE MALEATE 0.2 MG/ML IJ SOLN: 0.2000 mg | INTRAMUSCULAR | Status: DC | PRN

## 2018-04-17 MED ORDER — ONDANSETRON HCL 4 MG/2ML IJ SOLN: 4.0000 mg | Freq: Three times a day (TID) | INTRAMUSCULAR | Status: DC | PRN

## 2018-04-17 MED ORDER — CEFAZOLIN SODIUM-DEXTROSE 2-4 GM/100ML-% IV SOLN
INTRAVENOUS | Status: AC
  Filled 2018-04-17: qty 100

## 2018-04-17 MED ORDER — DIBUCAINE 1 % RE OINT: 1.0000 "application " | TOPICAL_OINTMENT | RECTAL | Status: DC | PRN

## 2018-04-17 MED ORDER — METHYLERGONOVINE MALEATE 0.2 MG PO TABS: 0.2000 mg | ORAL_TABLET | ORAL | Status: DC | PRN

## 2018-04-17 MED ORDER — SIMETHICONE 80 MG PO CHEW
80.0000 mg | CHEWABLE_TABLET | Freq: Three times a day (TID) | ORAL | Status: DC
  Administered 2018-04-18 – 2018-04-19 (×3): 80 mg via ORAL
  Filled 2018-04-17 (×3): qty 1

## 2018-04-17 MED ORDER — NALBUPHINE HCL 10 MG/ML IJ SOLN: 5.0000 mg | INTRAMUSCULAR | Status: DC | PRN

## 2018-04-17 MED ORDER — OXYTOCIN 10 UNIT/ML IJ SOLN
INTRAVENOUS | Status: DC | PRN
  Administered 2018-04-17: 40 [IU] via INTRAVENOUS

## 2018-04-17 MED ORDER — ACETAMINOPHEN 500 MG PO TABS
ORAL_TABLET | ORAL | Status: AC
  Filled 2018-04-17: qty 2

## 2018-04-17 MED ORDER — SOD CITRATE-CITRIC ACID 500-334 MG/5ML PO SOLN
ORAL | Status: AC
  Filled 2018-04-17: qty 30

## 2018-04-17 MED ORDER — DIPHENHYDRAMINE HCL 50 MG/ML IJ SOLN: 12.5000 mg | INTRAMUSCULAR | Status: DC | PRN

## 2018-04-17 MED ORDER — ONDANSETRON HCL 4 MG/2ML IJ SOLN
INTRAMUSCULAR | Status: DC | PRN
  Administered 2018-04-17: 4 mg via INTRAVENOUS

## 2018-04-17 MED ORDER — OXYCODONE HCL 5 MG PO TABS
10.0000 mg | ORAL_TABLET | Freq: Once | ORAL | Status: AC
  Administered 2018-04-17: 10 mg via ORAL
  Filled 2018-04-17: qty 2

## 2018-04-17 MED ORDER — PRENATAL MULTIVITAMIN CH
1.0000 | ORAL_TABLET | Freq: Every day | ORAL | Status: DC
  Administered 2018-04-18 – 2018-04-19 (×2): 1 via ORAL
  Filled 2018-04-17 (×2): qty 1

## 2018-04-17 MED ORDER — TETANUS-DIPHTH-ACELL PERTUSSIS 5-2.5-18.5 LF-MCG/0.5 IM SUSP: 0.5000 mL | Freq: Once | INTRAMUSCULAR | Status: DC

## 2018-04-17 MED ORDER — LACTATED RINGERS IV SOLN
INTRAVENOUS | Status: DC
  Administered 2018-04-17: 21:00:00 via INTRAVENOUS

## 2018-04-17 MED ORDER — PROMETHAZINE HCL 25 MG/ML IJ SOLN: 6.2500 mg | INTRAMUSCULAR | Status: DC | PRN

## 2018-04-17 MED ORDER — GABAPENTIN 300 MG PO CAPS
ORAL_CAPSULE | ORAL | Status: AC
  Filled 2018-04-17: qty 1

## 2018-04-17 MED ORDER — MORPHINE SULFATE (PF) 0.5 MG/ML IJ SOLN
INTRAMUSCULAR | Status: DC | PRN
  Administered 2018-04-17: .15 mg via INTRATHECAL

## 2018-04-17 MED ORDER — IBUPROFEN 800 MG PO TABS
800.0000 mg | ORAL_TABLET | Freq: Three times a day (TID) | ORAL | Status: DC
  Administered 2018-04-17 – 2018-04-18 (×4): 800 mg via ORAL
  Filled 2018-04-17 (×5): qty 1

## 2018-04-17 MED ORDER — SODIUM CHLORIDE 0.9 % IV SOLN
INTRAVENOUS | Status: DC | PRN
  Administered 2018-04-17: 13:00:00 via INTRAVENOUS

## 2018-04-17 MED ORDER — SIMETHICONE 80 MG PO CHEW: 80.0000 mg | CHEWABLE_TABLET | ORAL | Status: DC | PRN

## 2018-04-17 MED ORDER — SENNOSIDES-DOCUSATE SODIUM 8.6-50 MG PO TABS
2.0000 | ORAL_TABLET | ORAL | Status: DC
  Administered 2018-04-18 (×2): 2 via ORAL
  Filled 2018-04-17 (×2): qty 2

## 2018-04-17 MED ORDER — OXYCODONE HCL 5 MG/5ML PO SOLN: 5.0000 mg | Freq: Once | ORAL | Status: DC | PRN

## 2018-04-17 MED ORDER — OXYTOCIN 10 UNIT/ML IJ SOLN
INTRAMUSCULAR | Status: AC
  Filled 2018-04-17: qty 4

## 2018-04-17 MED ORDER — SODIUM CHLORIDE 0.9 % IR SOLN
Status: DC | PRN
  Administered 2018-04-17: 1

## 2018-04-17 MED ORDER — KETOROLAC TROMETHAMINE 30 MG/ML IJ SOLN
INTRAMUSCULAR | Status: AC
  Filled 2018-04-17: qty 1

## 2018-04-17 MED ORDER — SCOPOLAMINE 1 MG/3DAYS TD PT72
MEDICATED_PATCH | TRANSDERMAL | Status: AC
  Filled 2018-04-17: qty 1

## 2018-04-17 MED ORDER — WITCH HAZEL-GLYCERIN EX PADS: 1.0000 "application " | MEDICATED_PAD | CUTANEOUS | Status: DC | PRN

## 2018-04-17 MED ORDER — ONDANSETRON HCL 4 MG/2ML IJ SOLN
INTRAMUSCULAR | Status: AC
  Filled 2018-04-17: qty 2

## 2018-04-17 MED ORDER — OXYTOCIN 40 UNITS IN NORMAL SALINE INFUSION - SIMPLE MED
INTRAVENOUS | Status: AC
  Filled 2018-04-17: qty 1000

## 2018-04-17 MED ORDER — DEXAMETHASONE SODIUM PHOSPHATE 4 MG/ML IJ SOLN
INTRAMUSCULAR | Status: AC
  Filled 2018-04-17: qty 1

## 2018-04-17 MED ORDER — FAMOTIDINE 20 MG PO TABS: 20.0000 mg | ORAL_TABLET | Freq: Two times a day (BID) | ORAL | Status: DC | PRN

## 2018-04-17 MED ORDER — MENTHOL 3 MG MT LOZG: 1.0000 | LOZENGE | OROMUCOSAL | Status: DC | PRN

## 2018-04-17 MED ORDER — NALOXONE HCL 4 MG/10ML IJ SOLN
1.0000 ug/kg/h | INTRAVENOUS | Status: DC | PRN
  Filled 2018-04-17: qty 5

## 2018-04-17 MED ORDER — LACTATED RINGERS IV SOLN
INTRAVENOUS | Status: DC
  Administered 2018-04-17 (×2): via INTRAVENOUS

## 2018-04-17 MED ORDER — NALBUPHINE HCL 10 MG/ML IJ SOLN: 5.0000 mg | Freq: Once | INTRAMUSCULAR | Status: DC | PRN

## 2018-04-17 MED ORDER — IBUPROFEN 800 MG PO TABS: 800.0000 mg | ORAL_TABLET | Freq: Three times a day (TID) | ORAL | Status: DC

## 2018-04-17 MED ORDER — ZOLPIDEM TARTRATE 5 MG PO TABS: 5.0000 mg | ORAL_TABLET | Freq: Every evening | ORAL | Status: DC | PRN

## 2018-04-17 MED ORDER — GABAPENTIN 300 MG PO CAPS
300.0000 mg | ORAL_CAPSULE | ORAL | Status: AC
  Administered 2018-04-17: 300 mg via ORAL

## 2018-04-17 MED ORDER — BISACODYL 10 MG RE SUPP: 10.0000 mg | Freq: Every day | RECTAL | Status: DC | PRN

## 2018-04-17 MED ORDER — DIPHENHYDRAMINE HCL 25 MG PO CAPS: 25.0000 mg | ORAL_CAPSULE | Freq: Four times a day (QID) | ORAL | Status: DC | PRN

## 2018-04-17 MED ORDER — FLEET ENEMA 7-19 GM/118ML RE ENEM: 1.0000 | ENEMA | Freq: Every day | RECTAL | Status: DC | PRN

## 2018-04-17 MED ORDER — BUPIVACAINE IN DEXTROSE 0.75-8.25 % IT SOLN
INTRATHECAL | Status: DC | PRN
  Administered 2018-04-17: 1.6 mL via INTRATHECAL

## 2018-04-17 MED ORDER — MEPERIDINE HCL 25 MG/ML IJ SOLN: 6.2500 mg | INTRAMUSCULAR | Status: DC | PRN

## 2018-04-17 MED ORDER — OXYCODONE HCL 5 MG PO TABS: 5.0000 mg | ORAL_TABLET | Freq: Once | ORAL | Status: DC | PRN

## 2018-04-17 MED ORDER — NALOXONE HCL 0.4 MG/ML IJ SOLN: 0.4000 mg | INTRAMUSCULAR | Status: DC | PRN

## 2018-04-17 MED ORDER — FENTANYL CITRATE (PF) 100 MCG/2ML IJ SOLN
INTRAMUSCULAR | Status: AC
  Filled 2018-04-17: qty 2

## 2018-04-17 MED ORDER — MEASLES, MUMPS & RUBELLA VAC IJ SOLR: 0.5000 mL | Freq: Once | INTRAMUSCULAR | Status: DC

## 2018-04-17 MED ORDER — CEFAZOLIN SODIUM-DEXTROSE 2-4 GM/100ML-% IV SOLN
2.0000 g | INTRAVENOUS | Status: AC
  Administered 2018-04-17: 2 g via INTRAVENOUS

## 2018-04-17 MED ORDER — OXYCODONE-ACETAMINOPHEN 5-325 MG PO TABS: 1.0000 | ORAL_TABLET | ORAL | Status: DC | PRN

## 2018-04-17 MED ORDER — FERROUS SULFATE 325 (65 FE) MG PO TABS
325.0000 mg | ORAL_TABLET | Freq: Two times a day (BID) | ORAL | Status: DC
  Administered 2018-04-18 – 2018-04-19 (×2): 325 mg via ORAL
  Filled 2018-04-17 (×2): qty 1

## 2018-04-17 MED ORDER — SIMETHICONE 80 MG PO CHEW
80.0000 mg | CHEWABLE_TABLET | ORAL | Status: DC
  Administered 2018-04-18 (×2): 80 mg via ORAL
  Filled 2018-04-17 (×2): qty 1

## 2018-04-17 MED ORDER — SODIUM CHLORIDE 0.9% FLUSH: 3.0000 mL | INTRAVENOUS | Status: DC | PRN

## 2018-04-17 MED ORDER — OXYTOCIN 40 UNITS IN NORMAL SALINE INFUSION - SIMPLE MED: 2.5000 [IU]/h | INTRAVENOUS | Status: AC

## 2018-04-17 MED ORDER — ACETAMINOPHEN 500 MG PO TABS
1000.0000 mg | ORAL_TABLET | Freq: Four times a day (QID) | ORAL | Status: DC | PRN
  Administered 2018-04-17 – 2018-04-19 (×6): 1000 mg via ORAL
  Filled 2018-04-17 (×6): qty 2

## 2018-04-17 MED ORDER — SCOPOLAMINE 1 MG/3DAYS TD PT72: 1.0000 | MEDICATED_PATCH | Freq: Once | TRANSDERMAL | Status: DC

## 2018-04-17 MED ORDER — KETOROLAC TROMETHAMINE 30 MG/ML IJ SOLN
30.0000 mg | Freq: Once | INTRAMUSCULAR | Status: AC | PRN
  Administered 2018-04-17: 30 mg via INTRAVENOUS

## 2018-04-17 MED ORDER — SOD CITRATE-CITRIC ACID 500-334 MG/5ML PO SOLN
30.0000 mL | ORAL | Status: AC
  Administered 2018-04-17: 30 mL via ORAL

## 2018-04-17 MED ORDER — DIPHENHYDRAMINE HCL 25 MG PO CAPS: 25.0000 mg | ORAL_CAPSULE | ORAL | Status: DC | PRN

## 2018-04-17 MED ORDER — FENTANYL CITRATE (PF) 100 MCG/2ML IJ SOLN
INTRAMUSCULAR | Status: DC | PRN
  Administered 2018-04-17: 15 ug via INTRATHECAL

## 2018-04-17 MED ORDER — COCONUT OIL OIL: 1.0000 "application " | TOPICAL_OIL | Status: DC | PRN

## 2018-04-17 SURGICAL SUPPLY — 35 items
APL SKNCLS STERI-STRIP NONHPOA (GAUZE/BANDAGES/DRESSINGS) ×1
BENZOIN TINCTURE PRP APPL 2/3 (GAUZE/BANDAGES/DRESSINGS) ×3 IMPLANT
CLAMP CORD UMBIL (MISCELLANEOUS) IMPLANT
CLIP FILSHIE TUBAL LIGA STRL (Clip) ×3 IMPLANT
CLOSURE WOUND 1/2 X4 (GAUZE/BANDAGES/DRESSINGS) ×1
CLOTH BEACON ORANGE TIMEOUT ST (SAFETY) ×3 IMPLANT
DRSG OPSITE POSTOP 4X10 (GAUZE/BANDAGES/DRESSINGS) ×3 IMPLANT
ELECT REM PT RETURN 9FT ADLT (ELECTROSURGICAL) ×3
ELECTRODE REM PT RTRN 9FT ADLT (ELECTROSURGICAL) ×1 IMPLANT
EXTRACTOR VACUUM BELL STYLE (SUCTIONS) IMPLANT
GLOVE BIO SURGEON STRL SZ7 (GLOVE) ×3 IMPLANT
GLOVE BIOGEL PI IND STRL 7.0 (GLOVE) ×1 IMPLANT
GLOVE BIOGEL PI INDICATOR 7.0 (GLOVE) ×2
GOWN STRL REUS W/TWL LRG LVL3 (GOWN DISPOSABLE) ×6 IMPLANT
KIT ABG SYR 3ML LUER SLIP (SYRINGE) IMPLANT
NDL HYPO 25X5/8 SAFETYGLIDE (NEEDLE) IMPLANT
NEEDLE HYPO 25X5/8 SAFETYGLIDE (NEEDLE) IMPLANT
NS IRRIG 1000ML POUR BTL (IV SOLUTION) ×3 IMPLANT
PACK C SECTION WH (CUSTOM PROCEDURE TRAY) ×3 IMPLANT
PAD OB MATERNITY 4.3X12.25 (PERSONAL CARE ITEMS) ×3 IMPLANT
PENCIL SMOKE EVAC W/HOLSTER (ELECTROSURGICAL) ×3 IMPLANT
RTRCTR C-SECT PINK 25CM LRG (MISCELLANEOUS) ×3 IMPLANT
STRIP CLOSURE SKIN 1/2X4 (GAUZE/BANDAGES/DRESSINGS) ×2 IMPLANT
SUT MNCRL 0 VIOLET CTX 36 (SUTURE) ×2 IMPLANT
SUT MONOCRYL 0 CTX 36 (SUTURE) ×8
SUT PDS AB 0 CTX 60 (SUTURE) IMPLANT
SUT PLAIN 2 0 XLH (SUTURE) ×2 IMPLANT
SUT VIC AB 0 CT1 27 (SUTURE) ×6
SUT VIC AB 0 CT1 27XBRD ANBCTR (SUTURE) ×2 IMPLANT
SUT VIC AB 2-0 CT1 27 (SUTURE) ×3
SUT VIC AB 2-0 CT1 TAPERPNT 27 (SUTURE) ×1 IMPLANT
SUT VIC AB 4-0 KS 27 (SUTURE) ×3 IMPLANT
TOWEL OR 17X24 6PK STRL BLUE (TOWEL DISPOSABLE) ×3 IMPLANT
TRAY FOLEY W/BAG SLVR 14FR LF (SET/KITS/TRAYS/PACK) ×3 IMPLANT
WATER STERILE IRR 1000ML POUR (IV SOLUTION) ×3 IMPLANT

## 2018-04-17 NOTE — Transfer of Care (Signed)
Immediate Anesthesia Transfer of Care Note  Patient: GEONNA DICKEL  Procedure(s) Performed: CESAREAN SECTION WITH BILATERAL TUBAL LIGATION (Bilateral Abdomen)  Patient Location: PACU  Anesthesia Type:Spinal  Level of Consciousness: awake, alert  and oriented  Airway & Oxygen Therapy: Patient Spontanous Breathing  Post-op Assessment: Report given to RN and Post -op Vital signs reviewed and stable  Post vital signs: Reviewed and stable  Last Vitals:  Vitals Value Taken Time  BP 104/58 04/17/2018  3:32 PM  Temp 36.4 C 04/17/2018  3:32 PM  Pulse 63 04/17/2018  3:32 PM  Resp 20 04/17/2018  3:32 PM  SpO2 100 % 04/17/2018  3:32 PM    Last Pain:  Vitals:   04/17/18 1532  TempSrc: Oral  PainSc:          Complications: No apparent anesthesia complications

## 2018-04-17 NOTE — Brief Op Note (Signed)
04/17/2018  1:46 PM  PATIENT:  Marissa Kline  38 y.o. female  PRE-OPERATIVE DIAGNOSIS:  REPEAT and undesired fertility EDD: 04/22/18 NKDA  POST-OPERATIVE DIAGNOSIS:  REPEAT and undesired fertility EDD: 04/22/18  PROCEDURE:  Procedure(s): CESAREAN SECTION WITH BILATERAL TUBAL LIGATION (Bilateral)  SURGEON:  Surgeon(s) and Role:    Carrington Clamp, MD - Primary    Philip Aspen, DO - Assisting   ANESTHESIA:   spinal  EBL:  426 mL   SPECIMEN:  No Specimen  DISPOSITION OF SPECIMEN:  PATHOLOGY  COUNTS:  YES  TOURNIQUET:  * No tourniquets in log *  DICTATION: .Note written in EPIC  PLAN OF CARE: Admit to inpatient   PATIENT DISPOSITION:  PACU - hemodynamically stable.   Delay start of Pharmacological VTE agent (>24hrs) due to surgical blood loss or risk of bleeding: not applicable

## 2018-04-17 NOTE — Op Note (Addendum)
04/17/2018  12:26 PM  PATIENT:  Marissa Kline  38 y.o. female  PRE-OPERATIVE DIAGNOSIS:  REPEAT and undesired fertility EDD: 04/22/18 NKDA  POST-OPERATIVE DIAGNOSIS:  * No post-op diagnosis entered *  PROCEDURE:  Procedure(s): CESAREAN SECTION WITH BILATERAL TUBAL LIGATION (Bilateral)  SURGEON:  Surgeon(s) and Role:    Carrington Clamp, MD - Primary    * Philip Aspen, DO - Assisting  ANESTHESIA:   spinal  EBL:  350cc   SPECIMEN:  No Specimen  DISPOSITION OF SPECIMEN:  N/A  COUNTS:  YES  TOURNIQUET:  * No tourniquets in log *  DICTATION: .Note written in EPIC  PLAN OF CARE: Admit to inpatient   PATIENT DISPOSITION:  PACU - hemodynamically stable.   Delay start of Pharmacological VTE agent (>24hrs) due to surgical blood loss or risk of bleeding: not applicable.  Complications:  none Medications:  Ancef, Pitocin Findings:  Baby female, Apgars 8,9, weight P.   Normal tubes, ovaries and uterus seen.  Baby was skin to skin with mother after birth in the OR.  Technique:  After adequate spinal anesthesia was achieved, the patient was prepped and draped in usual sterile fashion.  A foley catheter was used to drain the bladder.  A pfannanstiel incision was made with the scalpel and carried down to the fascia with the bovie cautery. The fascia was incised in the midline with the scalpel and carried in a transverse curvilinear manner bilaterally.  The fascia was reflected superiorly and inferiorly off the rectus muscles and the muscles split in the midline.  A bowel free portion of the peritoneum was entered bluntly and then extended in a superior and inferior manner with good visualization of the bowel and bladder.   The Alexis instrument was then placed and the vesico-uterine fascia tented up and incised in a transverse curvilinear manner.  A 2 cm transverse incision was made in the upper portion of the lower uterine segment until the amnion was exposed.   The incision was  extended transversely in a blunt manner.  Clear fluid was noted and the baby delivered in the vertex presentation without complication.  The baby was bulb suctioned and the cord was clamped and cut afte 1 minute.  The baby was then handed to awaiting Neonatology.  The placenta was then delivered manually and the uterus cleared of all debris.  The uterine incision was then closed with a running lock stitch of 0 monocryl.  An imbricating layer of 0 monocryl was closed as well. Excellent hemostasis of the uterine incision was achieved with four more figure of eight stitches and the abdomen was cleared with irrigation. There was a small 3 cm hematoma under the serosa on the L side that was stable and not expanding.   Attention was then turned to the tubes - wach was identified to the fimbriated ends and a filshie clip placed around the entire circumference of the tube.  The peritoneum was closed with a running stitch of 2-0 vicryl.  This incorporated the rectus muscles as a separate layer.  The fascia was then closed with a running stitch of 0 vicryl.  The subcutaneous layer was closed with interrupted  stitches of 2-0 plain gut.  The skin was closed with 4-0 vicryl on a Keith needle and steri-strips.  The patient tolerated the procedure well and was returned to the recovery room in stable condition.  All counts were correct times three.  Marissa Kline

## 2018-04-17 NOTE — Progress Notes (Signed)
There has been no change in the patients history, status or exam since the history and physical.  Vitals:   04/17/18 1046 04/17/18 1058  BP:  105/60  Pulse:  91  Resp:  16  Temp:  98.1 F (36.7 C)  TempSrc: Oral Oral  SpO2:  100%  Weight: 84.4 kg   Height: 5\' 1"  (1.549 m)     Results for orders placed or performed during the hospital encounter of 04/16/18 (from the past 72 hour(s))  RPR     Status: None   Collection Time: 04/16/18  9:24 AM  Result Value Ref Range   RPR Ser Ql Non Reactive Non Reactive    Comment: (NOTE) Performed At: [facility] [address] [place], [place] [id number] Jolene Schimke MD [id number]   CBC     Status: Abnormal   Collection Time: 04/16/18  9:24 AM  Result Value Ref Range   WBC 7.4 4.0 - 10.5 K/uL   RBC 3.62 (L) 3.87 - 5.11 MIL/uL   Hemoglobin 9.9 (L) 12.0 - 15.0 g/dL   HCT 38.7 (L) 56.4 - 33.2 %   MCV 86.5 80.0 - 100.0 fL   MCH 27.3 26.0 - 34.0 pg   MCHC 31.6 30.0 - 36.0 g/dL   RDW 95.1 (H) 88.4 - 16.6 %   Platelets 169 150 - 400 K/uL   nRBC 0.0 0.0 - 0.2 %    Comment: Performed at [facility], [address] [place], [place] [address]  Type and screen     Status: None   Collection Time: 04/16/18  9:30 AM  Result Value Ref Range   ABO/RH(D) A POS    Antibody Screen NEG    Sample Expiration      04/19/2018 Performed at [facility], [address], [place], [place] [address]   ABO/Rh     Status: None   Collection Time: 04/16/18  9:30 AM  Result Value Ref Range   ABO/RH(D)      A POS Performed at [facility], [address], [place], [place] [address]     [name]

## 2018-04-17 NOTE — Brief Op Note (Signed)
04/17/2018  12:26 PM  PATIENT:  Benay Spice  38 y.o. female  PRE-OPERATIVE DIAGNOSIS:  REPEAT and undesired fertility EDD: 04/22/18 NKDA  POST-OPERATIVE DIAGNOSIS:  * No post-op diagnosis entered *  PROCEDURE:  Procedure(s): CESAREAN SECTION WITH BILATERAL TUBAL LIGATION (Bilateral)  SURGEON:  Surgeon(s) and Role:    Carrington Clamp, MD - Primary    * Philip Aspen, DO - Assisting  ANESTHESIA:   spinal  EBL:  350cc   SPECIMEN:  No Specimen  DISPOSITION OF SPECIMEN:  N/A  COUNTS:  YES  TOURNIQUET:  * No tourniquets in log *  DICTATION: .Note written in EPIC  PLAN OF CARE: Admit to inpatient   PATIENT DISPOSITION:  PACU - hemodynamically stable.   Delay start of Pharmacological VTE agent (>24hrs) due to surgical blood loss or risk of bleeding: not applicable.

## 2018-04-17 NOTE — Anesthesia Preprocedure Evaluation (Addendum)
Anesthesia Evaluation  Patient identified by MRN, date of birth, ID band Patient awake    Reviewed: Allergy & Precautions, H&P , NPO status , Patient's Chart, lab work & pertinent test results  Airway Mallampati: II  TM Distance: >3 FB Neck ROM: full    Dental no notable dental hx.    Pulmonary neg pulmonary ROS,    Pulmonary exam normal breath sounds clear to auscultation       Cardiovascular negative cardio ROS Normal cardiovascular exam Rhythm:Regular Rate:Normal     Neuro/Psych negative neurological ROS  negative psych ROS   GI/Hepatic negative GI ROS, Neg liver ROS,   Endo/Other  negative endocrine ROS  Renal/GU negative Renal ROS  negative genitourinary   Musculoskeletal negative musculoskeletal ROS (+)   Abdominal Normal abdominal exam  (+)   Peds  Hematology negative hematology ROS (+)   Anesthesia Other Findings   Reproductive/Obstetrics (+) Pregnancy                             Anesthesia Physical  Anesthesia Plan  ASA: II  Anesthesia Plan: Spinal   Post-op Pain Management:    Induction:   PONV Risk Score and Plan: 2 and Treatment may vary due to age or medical condition  Airway Management Planned: Natural Airway  Additional Equipment:   Intra-op Plan:   Post-operative Plan:   Informed Consent: I have reviewed the patients History and Physical, chart, labs and discussed the procedure including the risks, benefits and alternatives for the proposed anesthesia with the patient or authorized representative who has indicated his/her understanding and acceptance.       Plan Discussed with:   Anesthesia Plan Comments:        Anesthesia Quick Evaluation

## 2018-04-17 NOTE — Anesthesia Postprocedure Evaluation (Signed)
Anesthesia Post Note  Patient: Marissa Kline  Procedure(s) Performed: CESAREAN SECTION WITH BILATERAL TUBAL LIGATION (Bilateral Abdomen)     Patient location during evaluation: PACU Anesthesia Type: Spinal Level of consciousness: oriented and awake and alert Pain management: pain level controlled Vital Signs Assessment: post-procedure vital signs reviewed and stable Respiratory status: spontaneous breathing and respiratory function stable Cardiovascular status: blood pressure returned to baseline and stable Postop Assessment: no headache, no backache and no apparent nausea or vomiting Anesthetic complications: no    Last Vitals:  Vitals:   04/17/18 1500 04/17/18 1532  BP: (!) 110/58 (!) 104/58  Pulse: 75 63  Resp: (!) 28 20  Temp:  36.4 C  SpO2: 100% 100%    Last Pain:  Vitals:   04/17/18 1532  TempSrc: Oral  PainSc:    Pain Goal:                   Lowella Curb

## 2018-04-18 LAB — BIRTH TISSUE RECOVERY COLLECTION (PLACENTA DONATION)

## 2018-04-18 LAB — CBC
HCT: 28 % — ABNORMAL LOW (ref 36.0–46.0)
Hemoglobin: 8.9 g/dL — ABNORMAL LOW (ref 12.0–15.0)
MCH: 27.2 pg (ref 26.0–34.0)
MCHC: 31.8 g/dL (ref 30.0–36.0)
MCV: 85.6 fL (ref 80.0–100.0)
Platelets: 159 10*3/uL (ref 150–400)
RBC: 3.27 MIL/uL — ABNORMAL LOW (ref 3.87–5.11)
RDW: 17.7 % — ABNORMAL HIGH (ref 11.5–15.5)
WBC: 12.8 10*3/uL — AB (ref 4.0–10.5)
nRBC: 0 % (ref 0.0–0.2)

## 2018-04-18 MED ORDER — IBUPROFEN 800 MG PO TABS: 800.0000 mg | ORAL_TABLET | Freq: Three times a day (TID) | ORAL | 0 refills | Status: AC

## 2018-04-18 NOTE — Progress Notes (Signed)
Mom would like to stay overnight.  She did not realize d/c would take this long and states she lives in [place] and is concerned about baby's safety if they leave at this time of night.    Attending Physician Dareen Piano) notified and he is okay with canceling mom's d/c.    Charge RN aware.  Will continue routine pt care.

## 2018-04-18 NOTE — Lactation Note (Signed)
This note was copied from a baby's chart. Lactation Consultation Note Baby 13 hrs old. BF great per mom. Mom BF her now 38 yr old for 13 months w/o difficulty. Baby BF to Lt. Breast in cradle position when LC entered rm. Mom stated baby doesn't like the Rt. Side. LC placed baby in football position. Rt. Nipple short shaft that evert well w/stimulation. Breast very compressible. Baby latched well. Discussed support during feedings. Positions, hand expression, breast massage, cluster feeding, newborn behavior reviewed. Mom denies questions at this time. Encouraged to call if needs assistance or has questions. Lactation brochure left at bedside.  Patient Name: Marissa Kline Today's Date: 04/18/2018 Reason for consult: Initial assessment   Maternal Data Has patient been taught Hand Expression?: Yes Does the patient have breastfeeding experience prior to this delivery?: Yes  Feeding Feeding Type: Breast Fed  LATCH Score Latch: Grasps breast easily, tongue down, lips flanged, rhythmical sucking.  Audible Swallowing: A few with stimulation  Type of Nipple: Everted at rest and after stimulation     Hold (Positioning): No assistance needed to correctly position infant at breast.     Interventions Interventions: Breast feeding basics reviewed;Adjust position;Assisted with latch;Support pillows;Position options;Breast massage;Breast compression  Lactation Tools Discussed/Used WIC Program: No   Consult Status Consult Status: Follow-up Date: 04/19/18 Follow-up type: In-patient    [name], Diamond Nickel 04/18/2018, 2:25 AM

## 2018-04-18 NOTE — Addendum Note (Signed)
Addendum  created 04/18/18 1100 by Elgie Congo, CRNA   Clinical Note Signed

## 2018-04-18 NOTE — Discharge Summary (Signed)
Obstetric Discharge Summary Reason for Admission: cesarean section Prenatal Procedures: none Intrapartum Procedures: cesarean: low cervical, transverse Postpartum Procedures: none Complications-Operative and Postpartum: none Hemoglobin  Date Value Ref Range Status  04/18/2018 8.9 (L) 12.0 - 15.0 g/dL Final   HCT  Date Value Ref Range Status  04/18/2018 28.0 (L) 36.0 - 46.0 % Final    Discharge Diagnoses: Term Pregnancy-delivered  Discharge Information: Date: 04/18/2018 Activity: pelvic rest Diet: routine Medications: Iron and Percocet Condition: stable Instructions: refer to practice specific booklet Discharge to: home Follow-up Information    Carrington Clamp, MD Follow up in 4 week(s).   Specialty:  Obstetrics and Gynecology Contact information: [address] [place] [address] [phone]           Newborn Data: Live born female  Birth Weight: 7 lb 15.5 oz (3615 g) APGAR: 8, 9  Newborn Delivery   Birth date/time:  04/17/2018 13:07:00 Delivery type:  C-Section, Vacuum Assisted Trial of labor:  No C-section categorization:  Repeat     Home with mother.  [name] 04/18/2018, 9:22 AM

## 2018-04-18 NOTE — Anesthesia Postprocedure Evaluation (Signed)
Anesthesia Post Note  Patient: CORRINN SARTWELL  Procedure(s) Performed: CESAREAN SECTION WITH BILATERAL TUBAL LIGATION (Bilateral Abdomen)     Patient location during evaluation: Mother Baby Anesthesia Type: Spinal Level of consciousness: awake and alert Pain management: pain level controlled Vital Signs Assessment: post-procedure vital signs reviewed and stable Respiratory status: spontaneous breathing, nonlabored ventilation and respiratory function stable Cardiovascular status: stable Postop Assessment: no headache, no backache, no apparent nausea or vomiting, patient able to bend at knees, able to ambulate, spinal receding and adequate PO intake Anesthetic complications: no    Last Vitals:  Vitals:   04/18/18 0635 04/18/18 1041  BP: (!) 103/53 (!) 114/55  Pulse: 62 66  Resp: 18   Temp: 36.7 C 36.7 C  SpO2: 99%     Last Pain:  Vitals:   04/18/18 1041  TempSrc: Oral  PainSc:    Pain Goal:                   [employer]

## 2018-04-18 NOTE — Progress Notes (Signed)
Pt ambulated to bathroom and was able to void. Urine output was 100 mLs. Patient given juice to help urinate and advised to hydrate and drink plenty of water. Will attempt again at 0630 4hr check and determine then if straight catheterization needed.

## 2018-04-18 NOTE — Progress Notes (Signed)
POD#1 Pt without complaints. Lochia- mild VSSAF hgb-8.9 Imp/ Stable Plan/ routine care

## 2018-04-18 NOTE — Progress Notes (Signed)
Pt. Is upset due to the new visitation restriction in which only the support person can come; house coverage informed and RN requested she visit with the patient.  Before house coverage came to speak with the patient, the patient came out of her room with her infant in her arms and attempted to get on the elevator to go down and see her sister, in which the patient stated had driven 3 hours to see her.  House coverage came and spoke with patient who asked for a discharge to go home.  MD notified; Dr. Dareen Piano on call and spoke with RN, stating he would be OK with discharging patient but it would be a couple of hours before he could come to do it.  Pediatrician also called and awaiting decision of pediatrician.  Will continue to monitor.

## 2018-04-19 ENCOUNTER — Encounter (HOSPITAL_COMMUNITY): Payer: Self-pay | Admitting: *Deleted

## 2018-04-19 MED ORDER — OXYCODONE-ACETAMINOPHEN 5-325 MG PO TABS: 1.0000 | ORAL_TABLET | ORAL | 0 refills | Status: AC | PRN

## 2018-04-19 NOTE — Lactation Note (Signed)
This note was copied from a baby's chart. Lactation Consultation Note  Patient Name: Marissa Kline [id number] Date: 04/19/2018 Reason for consult: Follow-up assessment;Primapara;1st time breastfeeding;Term  P2 mother whose infant is now 75 hours old.  Baby was sleeping in bassinet when I arrived.  Mother is feeling quite comfortable with breast feeding with the exception of being able to get baby latched when she does not want to open wide.  I provided tips on how to obtain the deeper latch and how to maintain the latch once obtained.  Discussed the chin tug if mother gets baby latched but the intitial soreness does not dissipate.  Encouraged mother to be patient even if it takes a few tries to obtain a successful latch.  Mother verbalized understanding.  Mother's breasts are soft and non tender and nipples are everted and intact.  Engorgement prevention/treatment discussed.  Provided a manual pump for home use.  Changed the #24 flange to a #27 flange for greater comfort.  Mother has a DEBP for home use.  She has our OP phone number to call for questions after discharge.  She may be interested in returning for an OP LC and I explained how she can call to set up a visit.  Mother will decided in a couple of days if she desires to follow through with this.  Father present and supportive.  Pediatrician in room for assessment and discharge instructions.   Maternal Data Formula Feeding for Exclusion: No Has patient been taught Hand Expression?: Yes Does the patient have breastfeeding experience prior to this delivery?: No  Feeding    LATCH Score                   Interventions    Lactation Tools Discussed/Used WIC Program: No   Consult Status Consult Status: Complete Date: 04/19/18 Follow-up type: Call as needed    Kaimen Peine R Staphanie Harbison 04/19/2018, 10:54 AM

## 2018-04-19 NOTE — Progress Notes (Signed)
  Patient is eating, ambulating, voiding.  Pain control is good.  Vitals:   04/18/18 0635 04/18/18 1041 04/18/18 1512 04/18/18 2200  BP: (!) 103/53 (!) 114/55 111/67 (!) 111/52  Pulse: 62 66 77 63  Resp: 18  20 18   Temp: 98 F (36.7 C) 98 F (36.7 C) 98.3 F (36.8 C) 98 F (36.7 C)  TempSrc: Oral Oral Oral Oral  SpO2: 99%  100% 100%  Weight:      Height:        lungs:   clear to auscultation cor:    RRR Abdomen:  soft, appropriate tenderness, incisions intact and without erythema or exudate ex:    no cords   Lab Results  Component Value Date   WBC 12.8 (H) 04/18/2018   HGB 8.9 (L) 04/18/2018   HCT 28.0 (L) 04/18/2018   MCV 85.6 04/18/2018   PLT 159 04/18/2018    --/--/A POS, A POS Performed at [facility], [address] [place], [place] [address]  (03/09 0930)/[place]  A/P    Post operative day 2.  Routine post op and postpartum care.  Expect d/c today.  Percocet for pain control.

## 2019-04-06 IMAGING — MG DIGITAL DIAGNOSTIC BILATERAL MAMMOGRAM WITH TOMO AND CAD
8 series · 9 of 24 positions shown · non-contrast
Comparison: None.

CLINICAL DATA: 36-year-old female with diffuse, intermittent
"pulling" sensation and associated tenderness in the left breast.

EXAM:
DIGITAL DIAGNOSTIC BILATERAL MAMMOGRAM WITH CAD AND TOMO

[L CC synth-2D]
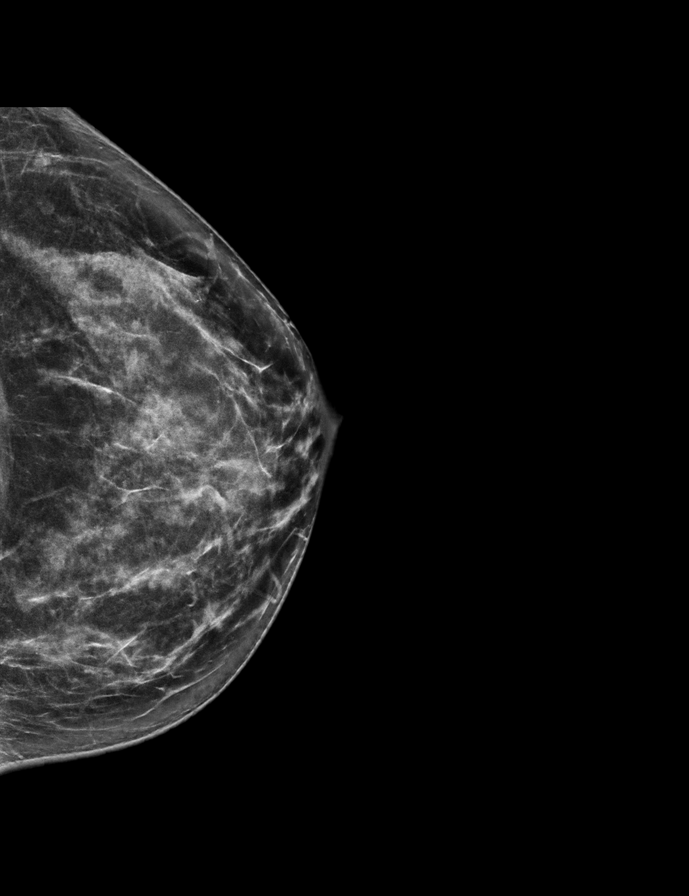

[R CC synth-2D]
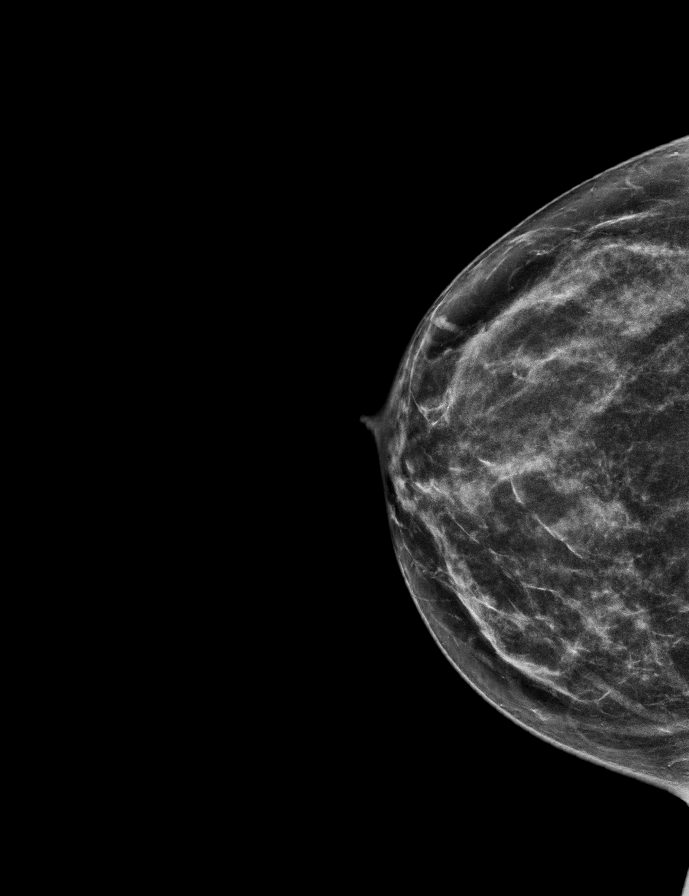

[L MLO synth-2D]
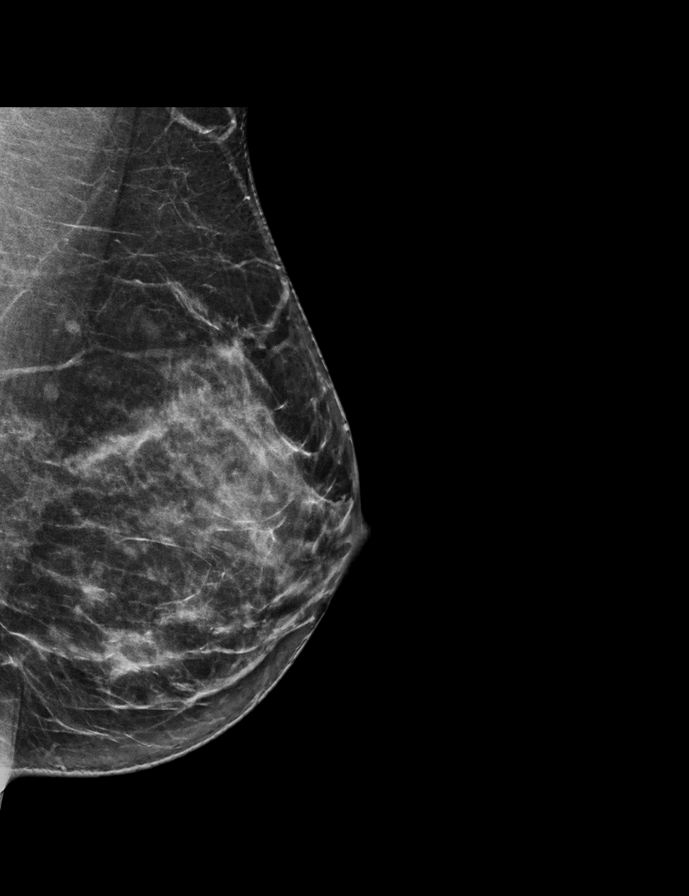

[R MLO synth-2D]
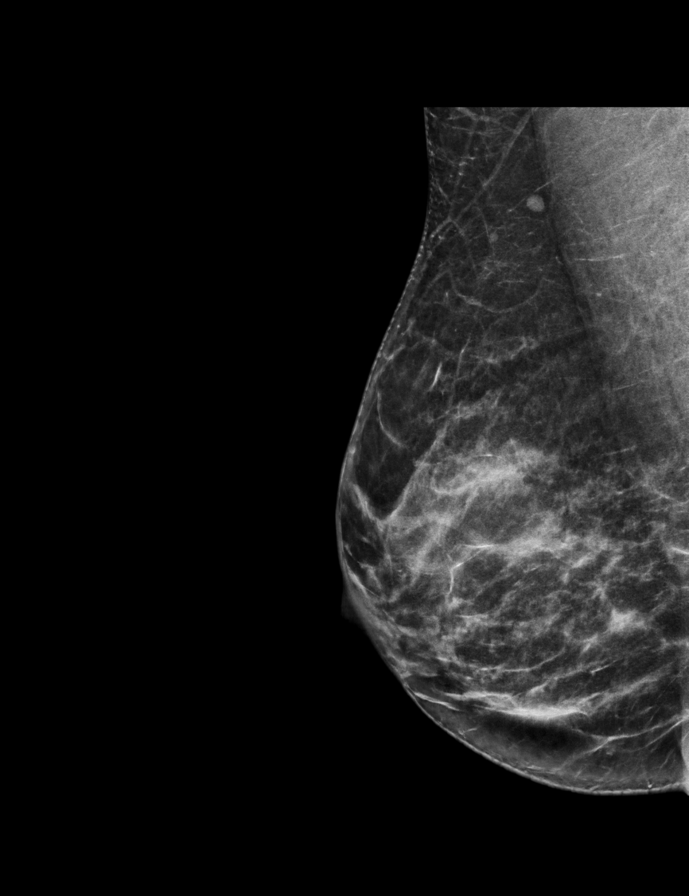

[R MLO tomo · 2 of 63 frames shown]
[frame 21/63]
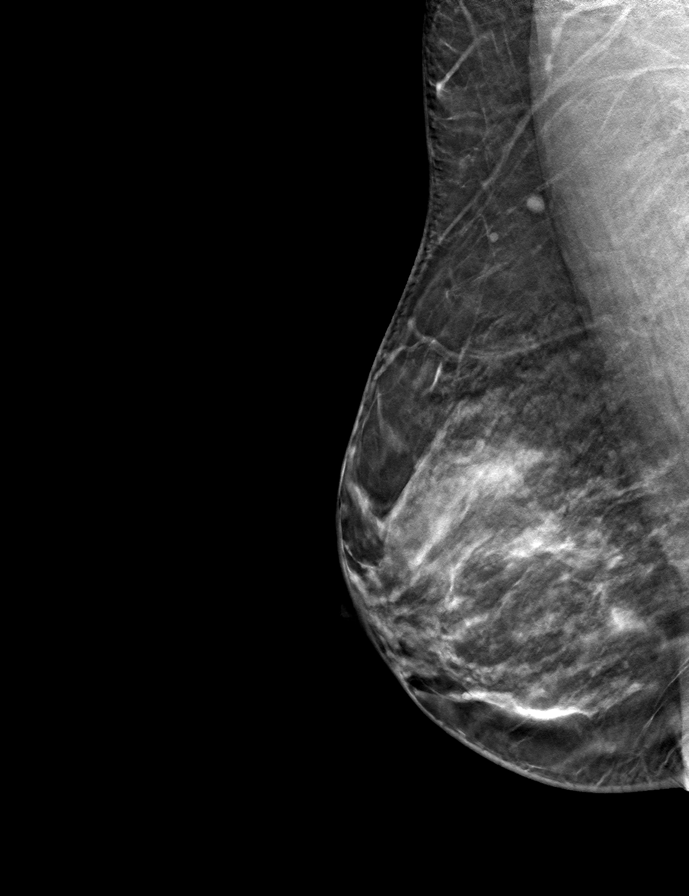
[frame 32/63]
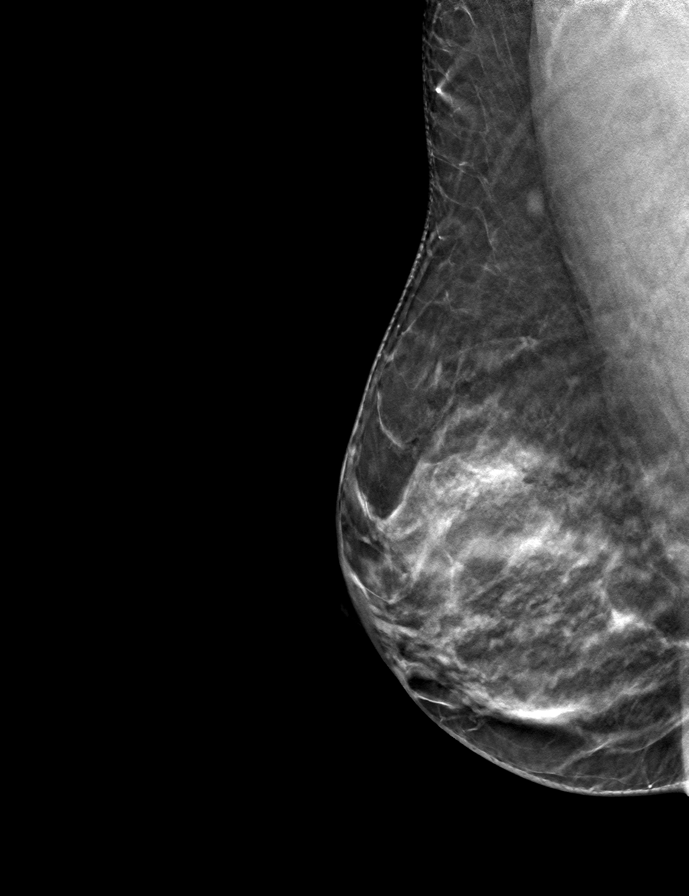

[L CC tomo · tomo slice 31/62.0]
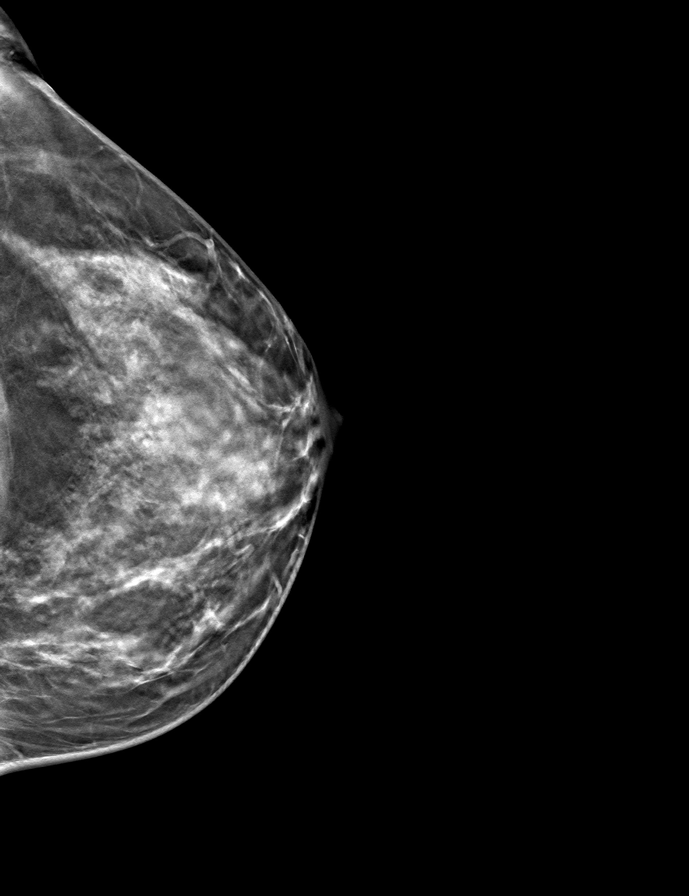

[L MLO tomo · tomo slice 33/64.0]
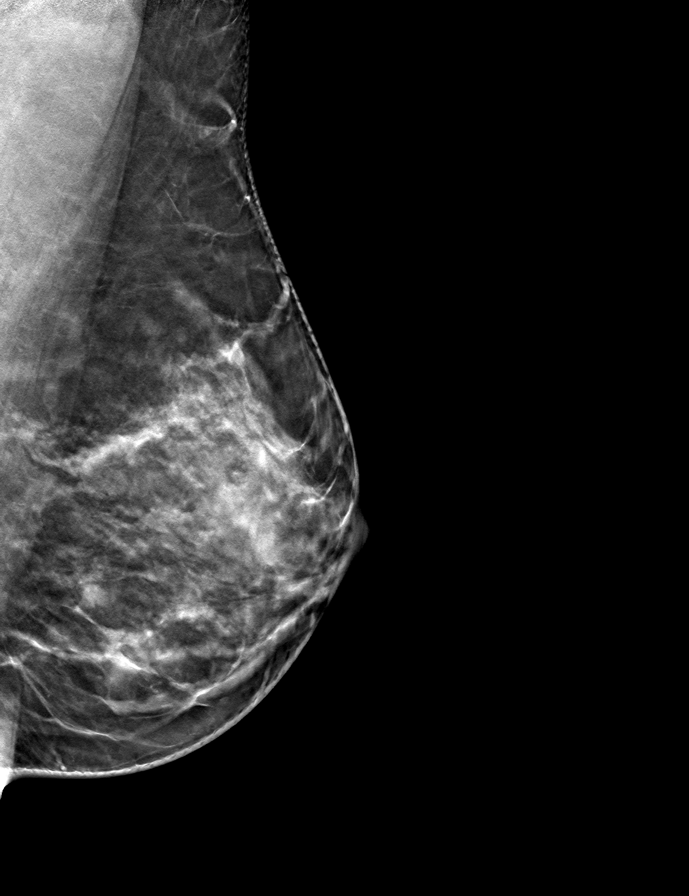

[R CC tomo · tomo slice 29/58.0]
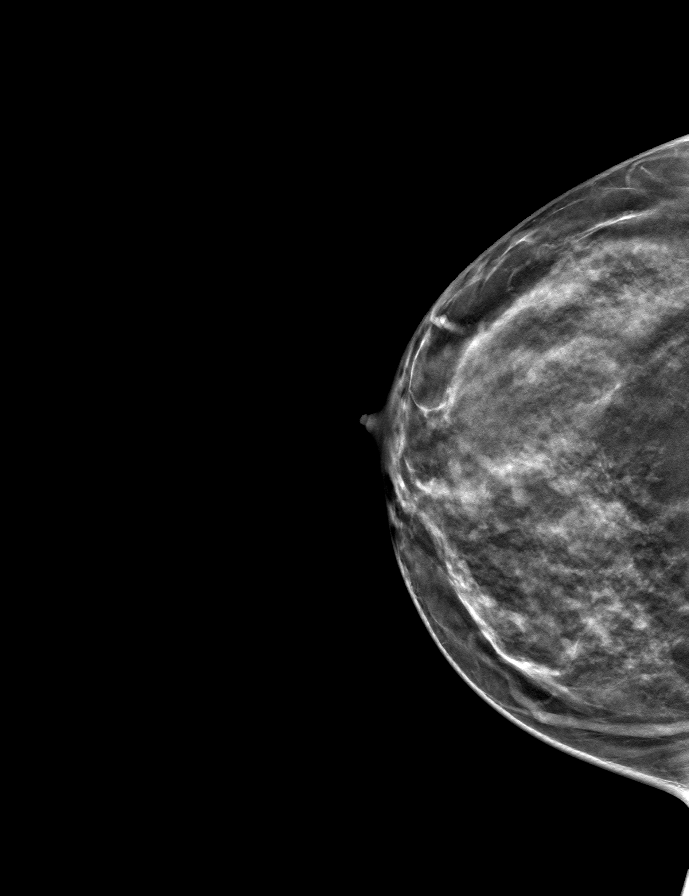

[9 of 24 positions shown; findings below may reference images not displayed]

ACR Breast Density Category c: The breast tissue is heterogeneously
dense, which may obscure small masses.
FINDINGS: No suspicious mammographic findings are identified in either breast.

Mammographic images were processed with CAD.
IMPRESSION: No mammographic evidence of malignancy. Recommendation is for
clinical and symptomatic follow-up of the patient's symptoms.

RECOMMENDATION:
1. Clinical follow-up recommended for the area of concern in the
left breast. Any further workup should be based on clinical grounds.
Patient was encouraged to follow-up with referring physician if pain
became localized and persistent or if a palpable lump/mass
developed.
2. Screening mammogram at age 40 unless there are persistent or
intervening clinical concerns. (Code:[id number])

I have discussed the findings and recommendations with the patient.
Results were also provided in writing at the conclusion of the
visit. If applicable, a reminder letter will be sent to the patient
regarding the next appointment.

BI-RADS CATEGORY  1: Negative.

## 2021-11-11 ENCOUNTER — Other Ambulatory Visit: Payer: Self-pay | Admitting: Family Medicine

## 2021-11-11 DIAGNOSIS — E010 Iodine-deficiency related diffuse (endemic) goiter: Secondary | ICD-10-CM

## 2021-12-03 ENCOUNTER — Other Ambulatory Visit: Payer: 59

## 2021-12-17 ENCOUNTER — Ambulatory Visit
Admission: RE | Admit: 2021-12-17 | Discharge: 2021-12-17 | Disposition: A | Discharge status: Home / Self Care | Payer: 59 | Source: Ambulatory Visit | Attending: Family Medicine | Admitting: Family Medicine

## 2021-12-17 DIAGNOSIS — E010 Iodine-deficiency related diffuse (endemic) goiter: Secondary | ICD-10-CM

## 2024-03-14 ENCOUNTER — Other Ambulatory Visit: Payer: Self-pay

## 2024-03-14 ENCOUNTER — Emergency Department (HOSPITAL_BASED_OUTPATIENT_CLINIC_OR_DEPARTMENT_OTHER)
Admission: EM | Admit: 2024-03-14 | Discharge: 2024-03-14 | Disposition: A | Discharge status: Home / Self Care | Source: Home / Self Care | Attending: Emergency Medicine | Admitting: Emergency Medicine

## 2024-03-14 DIAGNOSIS — M7918 Myalgia, other site: Secondary | ICD-10-CM

## 2024-03-14 MED ORDER — ACETAMINOPHEN 500 MG PO TABS
1000.0000 mg | ORAL_TABLET | Freq: Once | ORAL | Status: AC
  Administered 2024-03-14: 1000 mg via ORAL
  Filled 2024-03-14: qty 2

## 2024-03-14 MED ORDER — IBUPROFEN 400 MG PO TABS
600.0000 mg | ORAL_TABLET | Freq: Once | ORAL | Status: AC
  Administered 2024-03-14: 600 mg via ORAL
  Filled 2024-03-14: qty 1

## 2024-03-14 MED ORDER — LIDOCAINE 5 % EX PTCH
3.0000 | MEDICATED_PATCH | Freq: Once | CUTANEOUS | Status: DC
  Administered 2024-03-14: 3 via TRANSDERMAL
  Filled 2024-03-14: qty 3

## 2024-03-14 MED ORDER — CYCLOBENZAPRINE HCL 10 MG PO TABS
10.0000 mg | ORAL_TABLET | Freq: Once | ORAL | Status: AC
  Administered 2024-03-14: 10 mg via ORAL
  Filled 2024-03-14: qty 1

## 2024-03-14 MED ORDER — CYCLOBENZAPRINE HCL 10 MG PO TABS: 10.0000 mg | ORAL_TABLET | Freq: Two times a day (BID) | ORAL | 0 refills | Status: AC | PRN
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
